# Patient Record
Sex: Female | Born: 1940 | Race: White | Hispanic: No | Smoking: Never smoker
Health system: Southern US, Community
[De-identification: ages and names within clinical notes are randomized; demographics above are authoritative.]

## PROBLEM LIST (undated history)

## (undated) DIAGNOSIS — I639 Cerebral infarction, unspecified: Secondary | ICD-10-CM

## (undated) DIAGNOSIS — I499 Cardiac arrhythmia, unspecified: Secondary | ICD-10-CM

## (undated) DIAGNOSIS — F329 Major depressive disorder, single episode, unspecified: Secondary | ICD-10-CM

## (undated) DIAGNOSIS — I10 Essential (primary) hypertension: Secondary | ICD-10-CM

## (undated) DIAGNOSIS — F32A Depression, unspecified: Secondary | ICD-10-CM

## (undated) DIAGNOSIS — I4891 Unspecified atrial fibrillation: Secondary | ICD-10-CM

## (undated) DIAGNOSIS — K219 Gastro-esophageal reflux disease without esophagitis: Secondary | ICD-10-CM

## (undated) DIAGNOSIS — E039 Hypothyroidism, unspecified: Secondary | ICD-10-CM

## (undated) HISTORY — PX: UPPER GI ENDOSCOPY: SHX6162

## (undated) HISTORY — DX: Cerebral infarction, unspecified: I63.9

## (undated) HISTORY — DX: Major depressive disorder, single episode, unspecified: F32.9

## (undated) HISTORY — PX: APPENDECTOMY: SHX54

## (undated) HISTORY — DX: Unspecified atrial fibrillation: I48.91

## (undated) HISTORY — PX: ABDOMINAL HYSTERECTOMY: SHX81

## (undated) HISTORY — DX: Depression, unspecified: F32.A

## (undated) HISTORY — PX: JOINT REPLACEMENT: SHX530

## (undated) HISTORY — PX: OTHER SURGICAL HISTORY: SHX169

## (undated) HISTORY — DX: Hypothyroidism, unspecified: E03.9

## (undated) HISTORY — PX: COLONOSCOPY: SHX174

---

## 2013-09-29 DIAGNOSIS — I639 Cerebral infarction, unspecified: Secondary | ICD-10-CM

## 2013-09-29 HISTORY — DX: Cerebral infarction, unspecified: I63.9

## 2013-10-06 ENCOUNTER — Encounter: Payer: Self-pay | Admitting: Cardiology

## 2013-10-07 ENCOUNTER — Encounter: Payer: Self-pay | Admitting: Internal Medicine

## 2013-10-07 ENCOUNTER — Ambulatory Visit: Payer: Self-pay | Admitting: Interventional Cardiology

## 2013-10-07 ENCOUNTER — Non-Acute Institutional Stay (SKILLED_NURSING_FACILITY): Payer: Medicare Other | Admitting: Internal Medicine

## 2013-10-07 ENCOUNTER — Telehealth: Payer: Self-pay | Admitting: *Deleted

## 2013-10-07 DIAGNOSIS — I639 Cerebral infarction, unspecified: Secondary | ICD-10-CM | POA: Insufficient documentation

## 2013-10-07 DIAGNOSIS — E039 Hypothyroidism, unspecified: Secondary | ICD-10-CM

## 2013-10-07 DIAGNOSIS — I4892 Unspecified atrial flutter: Secondary | ICD-10-CM | POA: Insufficient documentation

## 2013-10-07 DIAGNOSIS — N189 Chronic kidney disease, unspecified: Secondary | ICD-10-CM | POA: Insufficient documentation

## 2013-10-07 DIAGNOSIS — N183 Chronic kidney disease, stage 3 (moderate): Secondary | ICD-10-CM

## 2013-10-07 DIAGNOSIS — I6350 Cerebral infarction due to unspecified occlusion or stenosis of unspecified cerebral artery: Secondary | ICD-10-CM

## 2013-10-07 DIAGNOSIS — E7800 Pure hypercholesterolemia, unspecified: Secondary | ICD-10-CM

## 2013-10-07 DIAGNOSIS — I10 Essential (primary) hypertension: Secondary | ICD-10-CM | POA: Insufficient documentation

## 2013-10-07 NOTE — Telephone Encounter (Signed)
Beth Johnson with [facility] called with New Patient Referral. Patient is s/p CVA, currently in Rehab facility. PCP - Carilyn Goodpasture, PA [phone]).  Called patient's daughter, Madison Hickman ([phone]), to coordinate appointment.  Patient given option to be seen this morning, however patient's daughter is unable to get patient here this morning. Scheduled for Tuesday, December 9th, at 10:00 am, with Dr. Donato Schultz.  Daugther states that patient normally lives in [place] but was up here over Thanksgiving and had CVA episode, hospitalized and is now being treated at an in-patient rehab facility locally. Patient will be staying with daughter for after care/treatment.  Daughter will bring patient's medications, recent labwork results and any scans/medical notes/history with her to appointment. Also instructed to come at least 15 minutes early to check-in and provide new patient/insurance information. Currently patient is on Xarelto 15mg  by mouth daily. Daughter advised to call back for any questions or concerns. Provided her with location information for office.

## 2013-10-07 NOTE — Progress Notes (Signed)
Patient ID: Beth Johnson, female   DOB: [birth year], 72 y.o.   MRN: [id number]  [name] [place]   PCP: No primary provider on file.  Code Status: full code  Allergies  Allergen Reactions  . Penicillins   . Sulfa Antibiotics     Chief Complaint: new admit  HPI:  72 y/o female patient is here for STR after hospital admission with CVA on 09/29/13. New acute vs subacute cortical infarction was noted in right frontal convexity and She also had episode of atrial flutter. Thus this was thought to be an embolic stroke. She was started on aspirin and lipitor. Her xarelto for anticoagulation was renally dosed given her ckd stage 3. PT/OT and SLP saw her in hospital and recommended STR. She was also noted to have some memory loss. She has hx of hypothyroidism. She was seen in her room today. She is working well with therapy team. She has no concerns this visit.  Review of Systems  Constitutional: Negative for fever, chills, weight loss, malaise/fatigue and diaphoresis.  HENT: Negative for congestion, hearing loss and sore throat.   Eyes: Negative for blurred vision, double vision and discharge.  Respiratory: Negative for cough, sputum production, shortness of breath and wheezing.   Cardiovascular: Negative for chest pain, palpitations, orthopnea and leg swelling.  Gastrointestinal: Negative for heartburn, nausea, vomiting, abdominal pain, diarrhea and constipation.  Genitourinary: Negative for dysuria, urgency, frequency and flank pain.  Musculoskeletal: Negative for back pain, falls, joint pain and myalgias.  Skin: Negative for itching and rash.  Neurological: Negative for dizziness, tingling, focal weakness and headaches.  Psychiatric/Behavioral: Negative for depression and memory loss. The patient is not nervous/anxious.    pmh Hypothyroidism, dyslipidemia, ASD  psh Hysterectomy Total hip arthroplasty  Social hx No use of tobacco or alcohol. Lives alone in [place]. Has  her daughter and sister in [place]  Medications: Patient's Medications  New Prescriptions   No medications on file  Previous Medications   LEVOTHYROXINE (SYNTHROID, LEVOTHROID) 50 MCG TABLET    Take 50 mcg by mouth daily before breakfast.   RIVAROXABAN (XARELTO) 15 MG TABS TABLET    Take 15 mg by mouth daily with supper.  Modified Medications   No medications on file  Discontinued Medications   No medications on file     Physical Exam: Filed Vitals:   10/07/13 1443  BP: 134/93  Pulse: 60  Temp: 97.7 F (36.5 C)  Resp: 17  SpO2: 96%   General- elderly female in no acute distress Head- atraumatic, normocephalic Eyes- PERRLA, EOMI, no pallor, no icterus, no discharge Neck- no lymphadenopathy, no thyromegaly, no jugular vein distension, no carotid bruit Chest- no chest wall deformities, no chest wall tenderness Cardiovascular- normal s1,s2, irregular heart rate, no murmurs/ rubs/ gallops Respiratory- bilateral clear to auscultation, no wheeze, no rhonchi, no crackles Abdomen- bowel sounds present, soft, non tender, no organomegaly, no guarding or rigidity, no CVA tenderness Musculoskeletal- able to move all 4 extremities, no spinal and paraspinal tenderness, steady gait, no use of assistive device Neurological- no focal deficit, normal reflexes, normal muscle strength Psychiatry- alert and oriented to person, place and time, normal mood and affect   Labs reviewed:  09/30/13 wbc 8.8, hb 11.5, hct 34.5, plt 416, na 134, k 4, bun 12, cr 1.36, ca 8.4 09/29/13 tsh 2.65, t.chol 127, ldl 58, hdl 45, tg 119  Reviewed echocardiogram, ct head and CTA, see patient's chart for full detail  Assessment/Plan  CVA- had recent embolic stroke. bp has  been elevated in the facility. Will have her started on amlodipine 5 mg daily to help keep bp < 140/90. Also will add lipitor 10 mg daily with recent stroke for mortality benefit. Continue xarelto. No bleed reported. To work with PT and OT  for muscle group strengthening  A.flutter/ fib- continue xarelto. Rate is controlled at present. Monitor clinically  Hypothyroidism- continue current dose of levothyroxine, monitor clinically  CKD stage 3- to monitor and have bp under control. avoid NSAIDS  Family/ staff Communication: reviewed care plan with patient and nursing supervisor  Goals of care: return home after STR   Labs/tests ordered: cbc, cmp, tsh in 2-3 weeks

## 2013-10-10 ENCOUNTER — Encounter: Payer: Self-pay | Admitting: Cardiology

## 2013-10-10 ENCOUNTER — Encounter: Payer: Self-pay | Admitting: *Deleted

## 2013-10-11 ENCOUNTER — Ambulatory Visit (INDEPENDENT_AMBULATORY_CARE_PROVIDER_SITE_OTHER): Payer: Medicare Other | Admitting: Cardiology

## 2013-10-11 ENCOUNTER — Encounter: Payer: Self-pay | Admitting: Cardiology

## 2013-10-11 ENCOUNTER — Encounter (INDEPENDENT_AMBULATORY_CARE_PROVIDER_SITE_OTHER): Payer: Self-pay

## 2013-10-11 ENCOUNTER — Non-Acute Institutional Stay (SKILLED_NURSING_FACILITY): Payer: Medicare Other | Admitting: Internal Medicine

## 2013-10-11 ENCOUNTER — Encounter: Payer: Self-pay | Admitting: Internal Medicine

## 2013-10-11 VITALS — BP 134/89 | Ht 61.5 in | Wt 119.8 lb

## 2013-10-11 DIAGNOSIS — E039 Hypothyroidism, unspecified: Secondary | ICD-10-CM

## 2013-10-11 DIAGNOSIS — I10 Essential (primary) hypertension: Secondary | ICD-10-CM

## 2013-10-11 DIAGNOSIS — Z7901 Long term (current) use of anticoagulants: Secondary | ICD-10-CM | POA: Insufficient documentation

## 2013-10-11 DIAGNOSIS — Z8673 Personal history of transient ischemic attack (TIA), and cerebral infarction without residual deficits: Secondary | ICD-10-CM

## 2013-10-11 DIAGNOSIS — I4892 Unspecified atrial flutter: Secondary | ICD-10-CM

## 2013-10-11 DIAGNOSIS — I639 Cerebral infarction, unspecified: Secondary | ICD-10-CM

## 2013-10-11 DIAGNOSIS — I4891 Unspecified atrial fibrillation: Secondary | ICD-10-CM

## 2013-10-11 DIAGNOSIS — I6350 Cerebral infarction due to unspecified occlusion or stenosis of unspecified cerebral artery: Secondary | ICD-10-CM

## 2013-10-11 DIAGNOSIS — E7800 Pure hypercholesterolemia, unspecified: Secondary | ICD-10-CM

## 2013-10-11 MED ORDER — DILTIAZEM HCL ER COATED BEADS 120 MG PO CP24: 120.0000 mg | ORAL_CAPSULE | Freq: Every day | ORAL | Status: DC

## 2013-10-11 NOTE — Progress Notes (Signed)
Patient ID: Beth Johnson, female   DOB: Feb 02, 1941, 72 y.o.   MRN: [id number]  Location:  [facility] Kashay Cavenaugh L. Renato Gails, D.O., C.M.D.  PCP: Genelle Bal A Christoffersen  Allergies  Allergen Reactions  . Penicillins   . Sulfa Antibiotics     Chief Complaint  Patient presents with  . Discharge Note    discharge home    HPI:  72 yo female was here for rehab s/p stroke 09/29/13.  She had an episode of aflutter during the hospital stay also so it was thought that her stroke was embolic.  She was started on asa and lipitor, but her family does not want her on lipitor.  She was continued on xarelto.  She will need to continue to work with speech therapy outpatient but has completed her PT, OT.  She has some memory loss.      Review of Systems:  Review of Systems  Constitutional: Negative for malaise/fatigue.  HENT: Negative for congestion.   Eyes: Negative for blurred vision.  Respiratory: Negative for shortness of breath.   Cardiovascular: Negative for chest pain.  Gastrointestinal: Negative for constipation.  Genitourinary: Negative for dysuria.  Skin: Negative for rash.  Neurological: Negative for loss of consciousness and weakness.       Speech difficulty  Endo/Heme/Allergies: Bruises/bleeds easily.  Psychiatric/Behavioral: Positive for memory loss.     Past Medical History  Diagnosis Date  . CVA (cerebral infarction)     new onset A. fib with CVA 09/2013: LUE weakness, left visual field deficit, facial draw, altered cognition  . Atrial fibrillation   . Depression   . Hypothyroidism     Past Surgical History  Procedure Laterality Date  . Abdominal hysterectomy    . Bil hip replacement ( 2 on ? left)    . Appendectomy      Social History:   reports that she has never smoked. She does not have any smokeless tobacco history on file. Her alcohol and drug histories are not on file.  Family History  Problem Relation Age of Onset  . Heart attack Father     . CAD Father   . Gallstones Mother   . Cancer Mother     Medications: Patient's Medications  New Prescriptions   DILTIAZEM (CARDIZEM CD) 120 MG 24 HR CAPSULE    Take 1 capsule (120 mg total) by mouth daily.  Previous Medications   ESCITALOPRAM (LEXAPRO) 5 MG TABLET    Take 5 mg by mouth daily.   LEVOTHYROXINE (SYNTHROID, LEVOTHROID) 50 MCG TABLET    Take 50 mcg by mouth daily before breakfast.   RIVAROXABAN (XARELTO) 15 MG TABS TABLET    Take 15 mg by mouth daily with supper.  Modified Medications   No medications on file  Discontinued Medications   No medications on file    Physical Exam: Filed Vitals:   10/11/13 1526  BP: 105/62  Pulse: 86  Temp: 96.6 F (35.9 C)  Resp: 18  Height: 5\' 2"  (1.575 m)  Weight: 120 lb (54.432 kg)   Physical Exam  Assessment/Plan:   1. CVA (cerebral infarction) -completed short term PT, OT -to go home now with ST through home health -doing well -stroke felt to be embolic from aflutter  2. Hypertension -cont current meds for adequate bp control -cardiac prudent diet  3. Unspecified hypothyroidism -cont synthroid  4. Hypercholesterolemia -lipitor is recommended, but pt's family refuses it   5. Atrial flutter -was paroxysmal, but felt to be cause of  stroke -cont xarelto renally dosed-will need f/u cbc outpatient  Patient is being discharged with home health services:  Home health RN eval and ST  Patient is being discharged with the following durable medical equipment:  None needed  Patient has been advised to f/u with their PCP in 1-2 weeks to bring them up to date on their rehab stay.  They were provided with a 30 day supply of scripts for prescription medications and refills must be obtained from their PCP.  Of note, pt's daughter contacted the PCP during rehab stay and received order for lexapro and to stop the statin that had been restarted due to her recent stroke.  She will not be sent with a prescription for the statin  though it is recommended due to family desire not to use the medication.

## 2013-10-11 NOTE — Progress Notes (Signed)
[address] [place], [place]  [address] Phone: [phone] Fax:  [phone]  Date:  10/11/2013   ID:  Beth Johnson, DOB 31-Oct-1941, MRN [id number]  PCP:  Jolyn Nap   History of Present Illness: Beth Johnson is a 72 y.o. female status post CVA at rehabilitation facility here for evaluation of atrial flutter. Currently on Xarelto 15 mg a day.  I have reviewed Launa Grill note and this is a portion of it: "Pt was found on Thanksgiving in her home confused and with slurred speech, left facial draw and left sided weakness after she did not respond to calls all day. She was admitted as [facility] [place] that evening for an acute stroke. Assessment concluded it had likely occured in the prior 24-48 hours based on her CT findings and exam. She was also found to be in new onset a.fib.  She had LUE weakness, facial draw, confusion and some recall issues and left peripheral visual field deficits. She made tremendous gains while in the hospital, and at this point has regained all of her sight and most of her LUE strength. Still some ongoing issues with memory and recall.  However, according to her daughter there were already some preceding memory concerns prior to the CVA that had raised the question of an early dementia, and she would like to evaluate this further while her mom is here in [place]. Her mother has been reluctant to discuss or acknowledge this but has admitted to writing notes to herself to remember items and some forgetfulness when driving in unfamiliar places. Her daughter privately shared that she has noted noticeable forgetfulness in conversation, repetitious conversations, a flattening of affect and at times a sense that her mom is ignoring or cannot attend to a conversation. To this point there have been no major incidents or safety issues till the stroke. She did not/could not answer her phone all day on Thanksgiving when she was home  alone till her sister finally reached her that night and called police to the home along with EMS. Daughter states that her driving is restricted to a small repetitive course of familiar places in her hometown. Pt did have some sundowning in the hospital. 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 Her daughter would like to proceed with initiating evals with neurology for CVA follow up and memory issues , as well as a cardiologist. Pt is on Xarelto starting today- 15mg .  Pt has had [facility] full [facility] on which she performed quite well. They don't expect her to need to be there more than a week or so. She had a Berg balance scale and got 55/56. Main PT/OT deficits identified were some balance issues. Has almost full function of and strength in her LUE. There have been no swallowing i or choking ssues. She does have some partial aphasia and word findings issues at times and has been more forgetful , flat and confused at times since the stroke. Her daughter is also concerned she has been depressed as well before the stroke and mentions she and another family member have done well on lexapro.  They hope to transition Beth Johnson to their home here in [place] while she is unable to drive after SNF discharge  and get home therapy is appropriate with the ultimate goal of restoring her level of functioning such that she can be cleared to drive and return home.She is not a vegetarian. She eats chicken and fish, PB and lunch meat. Diet is fairly repetitious but pretty healthy. No prior hx of anemia..  Atrial Fibrillation F/U:  c/o Palpitations no. She has picked up her scrip for Xarelto 15 mg today. She is not on any rate controlling medications. She has no other complaints and her daughter has not  observed any breathlessness, DOE, chest pain, dizziness or syncope. Pt denies all of the above as well. She did not have any known cardiac hx prior to the stroke that her daughter is aware of. They are hopeful that she may be a candidate for cardioversion"  In May 2014 PCP saw her and dx of AFIB was made according to daughter. PT has cleared her. Will be out by Friday. 2 years ago ? Recent ECHO OK. Normal EF. HR mildly elevated in hospital. She currently is asymptomatic in regards to her atrial fibrillation. She's not realize that she is in atrial fibrillation. According to notes, there was a prior history of atrial septal defect that was not noted on her bubble study. In other words I assume her bubble study did not demonstrate this. Injection of contrast documented no intra-atrial shunt. Mild MR/mild TR. There were no significant narrowing of the neck arteries. Stroke occurred in subacute cortical infarction of right frontal convexity. Chest x-ray was unremarkable. Her daughter present for discussion is very integral in her care. Normally her mother resides in [place].   Wt Readings from Last 3 Encounters:  10/11/13 119 lb 12.8 oz (54.341 kg)     Past Medical History  Diagnosis Date  . CVA (cerebral infarction)     new onset A. fib with CVA 09/2013: LUE weakness, left visual field deficit, facial draw, altered cognition  . Atrial fibrillation   . Depression   . Hypothyroidism     Past Surgical History  Procedure Laterality Date  . Abdominal hysterectomy    . Bil hip replacement ( 2 on ? left)    . Appendectomy      Current Outpatient Prescriptions  Medication Sig Dispense Refill  . escitalopram (LEXAPRO) 5 MG tablet Take 5 mg by mouth daily.      Marland Kitchen levothyroxine (SYNTHROID, LEVOTHROID) 50 MCG tablet Take 50 mcg by mouth daily before breakfast.      . Rivaroxaban (XARELTO) 15 MG TABS tablet Take 15 mg by mouth daily with supper.       No current facility-administered medications  for this visit.    Allergies:    Allergies  Allergen Reactions  . Penicillins   . Sulfa Antibiotics     Social History:  The patient  reports that she has never smoked. She does not have any smokeless tobacco history on file.   Family History  Problem Relation Age of Onset  . Heart attack Father   . CAD Father   . Gallstones Mother   . Cancer Mother     ROS:  Please see the history of present illness.   Positive for stroke, no bleeding, no syncope, no chest pain, no shortness of breath   All other systems reviewed and negative.   PHYSICAL EXAM: VS:  BP 134/89  Ht 5' 1.5" (1.562 m)  Wt 119 lb 12.8 oz (54.341 kg)  BMI 22.27 kg/m2 Well nourished, well developed, in no acute distress HEENT: normal,  [place]/AT, EOMI Neck: no JVD, normal carotid upstroke, no bruit Cardiac: Irregularly irregular, rate 90 no murmur Lungs:  clear to auscultation bilaterally, no wheezing, rhonchi or rales Abd: soft, nontender, no hepatomegaly, no bruits Ext: no edema, 2+ distal pulses Skin: warm and dry GU: deferred Neuro: no focal abnormalities noted, AAO x 3  EKG:  Atrial fibrillation/flutter heart rate 90, no other abnormalities  ASSESSMENT AND PLAN:  1. Atrial fibrillation-likely permanent, has been in since 5/14, asymptomatic. We will continue with rate control strategy. My plan will be to add low-dose diltiazem CD 120 mg once a day to her drug regimen. Her heart rate currently at rest is 90. I will see her back in 2 weeks and likely place a 24-hour Holter monitor to ensure proper rate control. We had lengthy discussion about rate versus rhythm control. Given her persistent nature of atrial fibrillation and asymptomatic response , we will continue with rate control. I explained that it is likely that either cardioversion would be unsuccessful or shortly after cardioversion, atrial fibrillation may return. Also regardless of ability to take her out of atrial fibrillation, she still needs  anticoagulation. 2. Chronic anticoagulation-continue with Xarelto.  Currently at 15 mg because of chronic kidney disease. We will continue to monitor. Recent creatinine has been 1.57. 3. History of stroke - this occurred over Thanksgiving, unexpected, likely secondary to atrial fibrillation. They did not wish to take statin therapy. We discussed the pros and cons. Given the likely embolic source and normal LDL, no statin seems like a reasonable approach. 4. We will see back in 2 weeks.  Signed, Donato Schultz, MD [facility]  10/11/2013 10:31 AM

## 2013-10-11 NOTE — Patient Instructions (Addendum)
Your physician has recommended you make the following change in your medication:   1. Start Diltiazem CD 120mg  once daily  Your physician recommends that you schedule a follow-up appointment in: 2 weeks with Dr. Anne Fu

## 2013-10-19 ENCOUNTER — Telehealth: Payer: Self-pay | Admitting: Neurology

## 2013-10-19 ENCOUNTER — Other Ambulatory Visit: Payer: Self-pay | Admitting: Neurology

## 2013-10-19 DIAGNOSIS — I6350 Cerebral infarction due to unspecified occlusion or stenosis of unspecified cerebral artery: Secondary | ICD-10-CM

## 2013-10-19 NOTE — Telephone Encounter (Signed)
Revonda Standard from [facility] left message that the patient was referred for home health speech therapy instead of outpatient ST.  She is asking if we can make the outpatient referral, patient is seeing Dr. Pearlean Brownie Thursday for office visit.  I left her a message that I don't see who did the initial referral, and wasn't sure who should put in outpatient referral.  I asked her to return my call.

## 2013-10-19 NOTE — Telephone Encounter (Signed)
Okay to make referral for outpatient speech therapy

## 2013-10-19 NOTE — Telephone Encounter (Signed)
Referral has been made for outpatient ST, per Dr. Pearlean Brownie.

## 2013-10-19 NOTE — Telephone Encounter (Signed)
Spoke to Madison the ST from [facility], she thinks the referral came from the rehab facility the patient was in.  She was trying to be pro active for the patient's family and get the patient started, because the plan is to get her back to independent living in [place].  I explained that it's up to the doctor to decide if she needs the speech therapy but I would get him the message.

## 2013-10-20 ENCOUNTER — Encounter: Payer: Self-pay | Admitting: Neurology

## 2013-10-20 ENCOUNTER — Ambulatory Visit (INDEPENDENT_AMBULATORY_CARE_PROVIDER_SITE_OTHER): Payer: Medicare Other | Admitting: Neurology

## 2013-10-20 ENCOUNTER — Ambulatory Visit: Payer: Medicare Other | Attending: Internal Medicine | Admitting: Audiology

## 2013-10-20 VITALS — BP 111/75 | HR 76 | Temp 97.7°F | Ht 63.5 in | Wt 118.0 lb

## 2013-10-20 DIAGNOSIS — F0280 Dementia in other diseases classified elsewhere without behavioral disturbance: Secondary | ICD-10-CM | POA: Insufficient documentation

## 2013-10-20 DIAGNOSIS — H9193 Unspecified hearing loss, bilateral: Secondary | ICD-10-CM

## 2013-10-20 DIAGNOSIS — H918X9 Other specified hearing loss, unspecified ear: Secondary | ICD-10-CM | POA: Insufficient documentation

## 2013-10-20 DIAGNOSIS — H93293 Other abnormal auditory perceptions, bilateral: Secondary | ICD-10-CM

## 2013-10-20 DIAGNOSIS — H93299 Other abnormal auditory perceptions, unspecified ear: Secondary | ICD-10-CM | POA: Insufficient documentation

## 2013-10-20 MED ORDER — CEREFOLIN NAC 6-2-600 MG PO TABS: 1.0000 | ORAL_TABLET | ORAL | Status: DC

## 2013-10-20 MED ORDER — DONEPEZIL HCL 5 MG PO TABS: 5.0000 mg | ORAL_TABLET | Freq: Every day | ORAL | Status: DC

## 2013-10-20 NOTE — Progress Notes (Signed)
[facility] [address] [place]. [place] [address] [phone]       OFFICE CONSULT NOTE  [name] Date of Birth:  05/26/1941 Medical Record Number:  [id number]   Referring MD:  Carilyn Goodpasture, NP  Reason for Referral:  Stroke and dementia  HPI: 41 year Caucasian Lahey is accompanied by her daughter who provides most of the history. She was admitted on 11/I7/14 to [facility] in [facility] with sudden onset of dysarthria and left facial droop. She also was found to have new-onset atrial fibrillation and flutter. CT angiogram of the brain and neck did not reveal any large vessel stenosis. I do not have actual reports or films to look at but the patient's daughter had her electronic medical records on her I pad which I was able to scroll through and go through the records and review them. Lipid profile was normal hemoglobin A1c was 5.3 RPR was negative TSH was normal. CT scan showed a right frontal hypodensity consistent with an subacute stroke. MRI was attempted but patient was extremely claustrophobic and hence it could not be completed. Patient recovered her speech and left facial weakness quite soon. She however has had noted cognitive decline. The patient daughter states that she was aware of patient's progressive memory decline for the last couple of years. She lives alone in [place] but since her daughter lives in this 4 she was transferred for rehabilitation to [facility] here in [place]. Patient is improving and the daughter is not thinking of patient moving back home to [place]. She has had issues with sundowning and mild agitation and anxiety and has been started on Lexapro one week ago. She has had no issues with gait balance or falls. She does not have any delusions or hallucinations. Patient has never been tried on medications like Aricept. She has been started on xarelto for her atrial fibrillation and seems to  tolerating it well with only minor bruising and no bleeding. There is no family history of dementia. Patient has no history of seizures, significant head injury with loss of consciousness or other strokes. She has a two-year college degree and is retired and lives alone.  ROS:   14 system review of systems is positive for memory loss, confusion, anxiety, depression, sundowning only and all other systems negative  PMH:  Past Medical History  Diagnosis Date  . CVA (cerebral infarction)     new onset A. fib with CVA 09/2013: LUE weakness, left visual field deficit, facial draw, altered cognition  . Atrial fibrillation   . Depression   . Hypothyroidism     Social History:  History   Social History  . Marital Status: Unknown    Spouse Name: N/A    Number of Children: 1  . Years of Education: college   Occupational History  . retired    Social History Main Topics  . Smoking status: Never Smoker   . Smokeless tobacco: Not on file  . Alcohol Use: Yes  . Drug Use: No  . Sexual Activity: Not on file   Other Topics Concern  . Not on file   Social History Narrative  . No narrative on file    Medications:   Current Outpatient Prescriptions on File Prior to Visit  Medication Sig Dispense Refill  . diltiazem (CARDIZEM CD) 120 MG 24 hr capsule Take 1 capsule (120 mg total) by mouth daily.  30 capsule  4  . escitalopram (LEXAPRO) 5 MG tablet Take  5 mg by mouth daily.      Marland Kitchen levothyroxine (SYNTHROID, LEVOTHROID) 50 MCG tablet Take 50 mcg by mouth daily before breakfast.      . Rivaroxaban (XARELTO) 15 MG TABS tablet Take 15 mg by mouth daily with supper.       No current facility-administered medications on file prior to visit.    Allergies:   Allergies  Allergen Reactions  . Penicillins   . Sulfa Antibiotics     Physical Exam General: Frail petite elderly Caucasian lady seated, in no evident distress Head: head normocephalic and atraumatic. Orohparynx benign Neck: supple  with no carotid or supraclavicular bruits Cardiovascular: regular rate and rhythm, no murmurs Musculoskeletal: no deformity Skin:  no rash/petichiae Vascular:  Normal pulses all extremities Filed Vitals:   10/20/13 1323  BP: 111/75  Pulse: 76  Temp: 97.7 F (36.5 C)    Neurologic Exam Mental Status: Awake and fully alert. Oriented to place and time. Recent and remote memory diminished. Attention span, concentration and fund of knowledge poor. Mood and affect appropriate. Mini-Mental status exam score 22/28. Animal naming test 3 only. Clock drawing 2/4. Syntax of independence in activities of daily living score 6 which is independent. Instrumental activities of daily living scale 8. NPI-Q. scored 1 for depression 2 for anxeity 1 for motor disturbance and 1 for change in appetite. Cranial Nerves: Fundoscopic exam reveals sharp disc margins. Pupils equal, briskly reactive to light. Extraocular movements full without nystagmus. Visual fields full to confrontation. Hearing intact. Facial sensation intact. Face, tongue, palate moves normally and symmetrically.  Motor: Normal bulk and tone. Normal strength in all tested extremity muscles. Sensory.: intact to touch and pinprick and vibratory sensation.  Coordination: Rapid alternating movements normal in all extremities. Finger-to-nose and heel-to-shin performed accurately bilaterally. Gait and Station: Arises from chair without difficulty. Stance is normal. Gait demonstrates normal stride length and balance . Not able to heel, toe and tandem walk without difficulty.  Reflexes: 1+ and symmetric. Toes downgoing.   NIHSS  0 Modified Rankin  1   ASSESSMENT:  73 year Caucasian lady with a small right frontal embolic infarct from atrial fibrillation in November 2014 with suspect baseline mild dementia which is now exacerbated and is likely Alzheimer's   PLAN: I had a long discussion the patient and daughter regarding her recent stroke as well as  dementia and answered questions. Continue rivaroxaban for stroke prevention for atrial fibrillation. Start Cerefolin nac for mild dementia as well as Aricept 5 mg daily for one month increase if tolerated to 10 mg daily. I advised the patient to followup with a neurologist in the [place] area as she plans to move there soon.

## 2013-10-20 NOTE — Patient Instructions (Signed)
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 She has excellent word recognition in quiet. In minimal background noise her word recognition drops to poor.  In addition she has a severe auditory processing component, especially adversely affecting her left ear that is temporal processing in nature.   Recommend: 1)  TV amplifier. 2) Speech therapy for auditory processing and language processing assessment. 3)  Consider using Auditory Workout for the IPAD.       For PC's there is the LACE program.         [employer] Phonological Awareness,  Auditory Memory and Following Directions.Carlyn Reichert Kate Sable, Au.D., CCC-A Doctor of Audiology

## 2013-10-20 NOTE — Patient Instructions (Signed)
I had a long discussion the patient and daughter regarding her recent stroke as well as dementia and answered questions. Continue rivaroxaban for stroke prevention for atrial fibrillation. Start Cerefolin nac for mild dementia as well as Aricept 5 mg daily for one month increase if tolerated to 10 mg daily. I advised the patient to followup with a neurologist in the [place] area as she plans to move there soon.

## 2013-10-21 NOTE — Procedures (Signed)
[facility] [address] [place], [place]  [address] [phone]  AUDIOLOGICAL  EVALUATION  NAME: Beth Johnson  STATUS: Outpatient DOB:   April 29, 1941   DIAGNOSIS: Evaluate hearing loss, recent CVA (stroke)                           MRN: [id number]                                                                                      DATE: 10/21/2013   REFERENT: Jolyn Nap, MD  HISTORY: Beth Johnson,  was seen for an audiological evaluation following a recent "stroke this Thanksgiving".  Beth. Beth Johnson was accompanied by her daughter.  They report a "gradual hearing loss over the past couple of years". Her daughter has noticed that Beth Johnson "has a loss of high pitched noises" and is having difficulty "hearing in background noise".  Beth Johnson denies feeling dizzy, lightheaded or having vertigo.   It is important to note that there is a maternal family history of hearing loss.  EVALUATION: Pure tone air conduction testing showed 40-45 dBHL at 250Hz ; 20 dBHL at 500Hz  - 1000Hz ; 25 dBHL at 2000Hz ; 50dBHL at 4000hz  and 80-85 dBHL at 8000Hz .  The hearing loss is sensorineural.  Speech reception thresholds are 30 dBHL on the left and 25 dBHL on the right using recorded spondee word lists. Masked word recognition was 94% at 75 dBHL on the left at and 100% at 65 dBHL on the right using recorded NU-6 word lists, in quiet.  Otoscopic inspection reveals clear ear canals with visible tympanic membranes.  Tympanometry showed (Type A) with normal middle ear pressure and elevated to absent acoustic reflexes bilaterally.    Speech-in-Noise testing was performed to determine speech discrimination in the presence of background noise.  Beth Johnson scored 56 % in the right ear and 54 % in the left ear, when noise was presented 5 dB below speech. Beth Johnson is expected to have significant difficulty hearing and understanding in minimal background noise.       Competing  Sentences (CS) involved a different sentences being presented to each ear at different volumes. The instructions are to repeat the softer volume sentences. Posterior temporal issues will show poorer performance in the ear contralateral to the lobe involved. What is normal varies by age. Beth Johnson scored 90% in the right ear and 0% in the left ear.  The test results in the left ear are abnormal and indicate that Beth Johnson has severe left sided (right hemisphere based)  temporal processing issue.  Dichotic Digits (DD) presents different single digits to each ear. Both digits are to be repeated. Beth Johnson scored 86% in the right ear and 10% in the left ear. The test results indicate that [name] scored abnormal on the left side.   CONCLUSIONS: Beth. 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 The hearing loss is symmetrical, typical of presbycusis (hearing loss associated with aging), with no indication that this hearing loss is directly related to the recent stroke.  She  has excellent word recognition in quiet at loud conversational speech levels. However, In minimal background noise her word recognition drops to poor in each ear.  When speaking with Beth. Beth Johnson, she appeared to have more difficulty with communication than her hearing loss would suggest so auditory processing tests were administered.  Beth. Beth Johnson has a severe auditory processing component that is adversely affecting her and may be related to the recent stroke since her left ear (right hemisphere posterior temporal area) is primarily affected; however, other causes including dementia may create these findings.  Beth. Beth Johnson scored very poor and was unable to repeat most single digit numbers or sentences in the left ear, whereas most of these were correctly repeated in the right ear. Beth. Beth Johnson was noticeably upset by this information. As discussed  with the family, at home speech services by a speech pathologist may be the most helpful for Beth. Beth Johnson.  In addition, the computer based auditory processing programs listed below were recommended.  RECOMMENDATIONS: 1)  TV amplifier to allow Beth. Beth Johnson to increase loudness to a comfortable listening level and help reduce background noise. 2) Speech receptive and expressive language assessment with at home therapy for auditory processing therapy. 3)  To help improve hearing in minimal background noise and auditory processing skills consider using auditory processing computer programs at home such as the Auditory Workout for the IPAD or for PC's there is the Beth Johnson program. [employer] may be used on [employer] or IPAD and include Phonological Awareness,  Auditory Memory and Following Directions. 4) Monitor hearing closely with a repeat evaluation in 3 months-possibly consider a hearing aid evaluation at that time. 5) Strategies that help improve hearing include: a) Face the speaker directly. Optimal is having the speakers face well - lit.  Unless amplified, being within 3-6 feet of the speaker will enhance word recognition. b) Avoid having the speaker back-lit as this will minimize the ability to use cues from lip-reading, facial expression and gestures. c)  Word recognition is poorer in background noise. For optimal word recognition, turn off the TV, radio or noisy fan when engaging in conversation. In a restaurant, try to sit away from noise sources and close to the primary speaker.  d)  Ask for topic clarification from time to time in order to remain in the conversation.  Most people don't mind repeating or clarifying a point when asked.  If needed, explain the difficulty hearing in background noise or hearing loss.    Deborah L. Kate Sable, Au.D., CCC-A Doctor of Audiology

## 2013-10-24 ENCOUNTER — Telehealth: Payer: Self-pay

## 2013-10-24 ENCOUNTER — Ambulatory Visit (INDEPENDENT_AMBULATORY_CARE_PROVIDER_SITE_OTHER): Payer: Medicare Other | Admitting: Cardiology

## 2013-10-24 ENCOUNTER — Encounter: Payer: Self-pay | Admitting: Cardiology

## 2013-10-24 VITALS — BP 116/70 | HR 79 | Ht 63.5 in | Wt 118.0 lb

## 2013-10-24 DIAGNOSIS — Z7901 Long term (current) use of anticoagulants: Secondary | ICD-10-CM

## 2013-10-24 DIAGNOSIS — I4892 Unspecified atrial flutter: Secondary | ICD-10-CM

## 2013-10-24 DIAGNOSIS — F0280 Dementia in other diseases classified elsewhere without behavioral disturbance: Secondary | ICD-10-CM

## 2013-10-24 DIAGNOSIS — Z8673 Personal history of transient ischemic attack (TIA), and cerebral infarction without residual deficits: Secondary | ICD-10-CM

## 2013-10-24 MED ORDER — CEREFOLIN NAC 6-2-600 MG PO TABS: 1.0000 | ORAL_TABLET | Freq: Every day | ORAL | Status: DC

## 2013-10-24 NOTE — Telephone Encounter (Signed)
Daughter called requesting a refill on Cerefolin.  She would like it sent to [facility].  I have sent the Rx.  She is aware.

## 2013-10-24 NOTE — Patient Instructions (Signed)
Your physician recommends that you continue on your current medications as directed. Please refer to the Current Medication list given to you today.  Your physician recommends that you follow-up as needed.  

## 2013-10-24 NOTE — Progress Notes (Signed)
[address] [place], [place]  [address] Phone: [phone] Fax:  [phone]  Date:  10/24/2013   ID:  Beth Johnson, DOB Nov 10, 1940, MRN [id number]  PCP:  Jolyn Nap, MD   History of Present Illness: Beth Johnson is a 72 y.o. female status post CVA at rehabilitation facility here for evaluation of atrial flutter. Currently on Xarelto 15 mg a day.  I have reviewed Launa Grill note and this is a portion of it: "Pt was found on Thanksgiving in her home confused and with slurred speech, left facial draw and left sided weakness after she did not respond to calls all day. She was admitted as [facility] in [place] that evening for an acute stroke. Assessment concluded it had likely occured in the prior 24-48 hours based on her CT findings and exam. She was also found to be in new onset a.fib.  She had LUE weakness, facial draw, confusion and some recall issues and left peripheral visual field deficits. She made tremendous gains while in the hospital, and at this point has regained all of her sight and most of her LUE strength. Still some ongoing issues with memory and recall.  However, according to her daughter there were already some preceding memory concerns prior to the CVA that had raised the question of an early dementia, and she would like to evaluate this further while her mom is here in [place]. Her mother has been reluctant to discuss or acknowledge this but has admitted to writing notes to herself to remember items and some forgetfulness when driving in unfamiliar places. Her daughter privately shared that she has noted noticeable forgetfulness in conversation, repetitious conversations, a flattening of affect and at times a sense that her mom is ignoring or cannot attend to a conversation. To this point there have been no major incidents or safety issues till the stroke. She did not/could not answer her phone all day on Thanksgiving when she  was home alone till her sister finally reached her that night and called police to the home along with EMS. Daughter states that her driving is restricted to a small repetitive course of familiar places in her hometown. Pt did have some sundowning in the hospital. 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 Her daughter would like to proceed with initiating evals with neurology for CVA follow up and memory issues , as well as a cardiologist. Pt is on Xarelto starting today- 15mg .  Pt has had [facility] full [facility] on which she performed quite well. They don't expect her to need to be there more than a week or so. She had a Berg balance scale and got 55/56. Main PT/OT deficits identified were some balance issues. Has almost full function of and strength in her LUE. There have been no swallowing i or choking ssues. She does have some partial aphasia and word findings issues at times and has been more forgetful , flat and confused at times since the stroke. Her daughter is also concerned she has been depressed as well before the stroke and mentions she and another family member have done well on lexapro.  They hope to transition Ms. Volpe to their home here in [place] while she is unable to drive after  SNF discharge and get home therapy is appropriate with the ultimate goal of restoring her level of functioning such that she can be cleared to drive and return home.She is not a vegetarian. She eats chicken and fish, PB and lunch meat. Diet is fairly repetitious but pretty healthy. No prior hx of anemia..  Atrial Fibrillation F/U:  c/o Palpitations no. She has picked up her scrip for Xarelto 15 mg today. She is not on any rate controlling medications. She has no other complaints and her daughter has  not observed any breathlessness, DOE, chest pain, dizziness or syncope. Pt denies all of the above as well. She did not have any known cardiac hx prior to the stroke that her daughter is aware of."  In May 2014 PCP saw her and dx of AFIB was made according to daughter. Recent ECHO Normal EF. HR mildly elevated in hospital. She currently is asymptomatic in regards to her atrial fibrillation. She does not realize that she is in atrial fibrillation. According to notes, there was a prior history of atrial septal defect that was not noted/seen on her bubble study.  In other words I assume her bubble study did not demonstrate shunt. Injection of contrast documented no intra-atrial shunt. Mild MR/mild TR. There were no significant narrowing of the neck arteries. Stroke occurred in subacute cortical infarction of right frontal convexity. Chest x-ray was unremarkable. Her daughter present for discussion is very integral in her care. Normally her mother resides in [place].   10/24/13 that she has been doing very well, asymptomatic once again. At first, on auscultation she sounded very regular, on repeat however I noticed some mild irregularity, likely flutter pattern once again. Better rate control. She is tolerating medications well. She is going to be going to [place] soon.  Wt Readings from Last 3 Encounters:  10/24/13 118 lb (53.524 kg)  10/20/13 118 lb (53.524 kg)  10/11/13 120 lb (54.432 kg)     Past Medical History  Diagnosis Date  . CVA (cerebral infarction)     new onset A. fib with CVA 09/2013: LUE weakness, left visual field deficit, facial draw, altered cognition  . Atrial fibrillation   . Depression   . Hypothyroidism     Past Surgical History  Procedure Laterality Date  . Abdominal hysterectomy    . Bil hip replacement ( 2 on ? left)    . Appendectomy      Current Outpatient Prescriptions  Medication Sig Dispense Refill  . Calcium Citrate (CITRACAL PO) Take by mouth.      .  Cholecalciferol (VITAMIN D PO) Take 1,000 mg by mouth daily.      Marland Kitchen diltiazem (CARDIZEM CD) 120 MG 24 hr capsule Take 1 capsule (120 mg total) by mouth daily.  30 capsule  4  . donepezil (ARICEPT) 5 MG tablet Take 1 tablet (5 mg total) by mouth at bedtime. Take 1 tablet daily x 4 weeks then 10 mg daily  60 tablet  0  . escitalopram (LEXAPRO) 10 MG tablet Take 10 mg by mouth daily.      Marland Kitchen levothyroxine (SYNTHROID, LEVOTHROID) 50 MCG tablet Take 50 mcg by mouth daily before breakfast.      . Methylfol-Methylcob-Acetylcyst (CEREFOLIN NAC) 6-2-600 MG TABS Take 1 ampule by mouth 1 day or 1 dose.  30 each  0  . Omega-3 Fatty Acids (FISH OIL) 500 MG CAPS Take 500 mg by mouth daily.      . Rivaroxaban (XARELTO) 15 MG TABS tablet Take  15 mg by mouth daily with supper.       No current facility-administered medications for this visit.    Allergies:    Allergies  Allergen Reactions  . Penicillins   . Sulfa Antibiotics     Social History:  The patient  reports that she has never smoked. She does not have any smokeless tobacco history on file. She reports that she drinks alcohol. She reports that she does not use illicit drugs.   Family History  Problem Relation Age of Onset  . Heart attack Father   . CAD Father   . Gallstones Mother   . Cancer Mother     ROS:  Please see the history of present illness.   Positive for stroke, no bleeding, no syncope, no chest pain, no shortness of breath   All other systems reviewed and negative.   PHYSICAL EXAM: VS:  BP 116/70  Pulse 79  Ht 5' 3.5" (1.613 m)  Wt 118 lb (53.524 kg)  BMI 20.57 kg/m2  SpO2 99% Well nourished, well developed, in no acute distress HEENT: normal, [place]/AT, EOMI Neck: no JVD, normal carotid upstroke, no bruit Cardiac: Irregularly irregular, rate 70 no murmur Lungs:  clear to auscultation bilaterally, no wheezing, rhonchi or rales Abd: soft, nontender, no hepatomegaly, no bruits Ext: no edema, 2+ distal pulses Skin: warm and  dry GU: deferred Neuro: no focal abnormalities noted, AAO x 3  EKG:  Atrial fibrillation/flutter heart rate 90, no other abnormalities  ASSESSMENT AND PLAN:  1. Atrial fibrillation-likely permanent, has been in since 5/14, asymptomatic. We will continue with rate control strategy. Low-dose diltiazem CD 120 mg once a day. Her heart rate currently is in the 70s. She is doing well.. We had lengthy discussion about rate versus rhythm control. Given her persistent nature of atrial fibrillation/flutter and asymptomatic response , we will continue with rate control. I explained that it is likely that either cardioversion would be unsuccessful or shortly after cardioversion, atrial fibrillation may return. Also regardless of ability to take her out of atrial fibrillation, she still needs anticoagulation. 2. Chronic anticoagulation-continue with Xarelto.  Currently at 15 mg because of chronic kidney disease. We will continue to monitor. Recent creatinine has been 1.57. 3. History of stroke - this occurred over Thanksgiving, unexpected, likely secondary to atrial fibrillation. They did not wish to take statin therapy. We discussed the pros and cons. Given the likely embolic source and normal LDL, no statin seems like a reasonable approach. 4. We will see her back on as-needed basis as she is getting a new cardiologist in [place]. We'll be happy to supply with any records as needed.   Signed, Donato Schultz, MD [facility]  10/24/2013 2:16 PM

## 2013-11-09 ENCOUNTER — Other Ambulatory Visit: Payer: Self-pay

## 2013-11-09 MED ORDER — DILTIAZEM HCL ER COATED BEADS 120 MG PO CP24: 120.0000 mg | ORAL_CAPSULE | Freq: Every day | ORAL | Status: DC

## 2013-11-14 ENCOUNTER — Telehealth: Payer: Self-pay

## 2013-11-14 DIAGNOSIS — F0280 Dementia in other diseases classified elsewhere without behavioral disturbance: Secondary | ICD-10-CM | POA: Diagnosis not present

## 2013-11-14 DIAGNOSIS — IMO0001 Reserved for inherently not codable concepts without codable children: Secondary | ICD-10-CM | POA: Diagnosis not present

## 2013-11-14 DIAGNOSIS — N183 Chronic kidney disease, stage 3 unspecified: Secondary | ICD-10-CM | POA: Diagnosis not present

## 2013-11-14 DIAGNOSIS — I69920 Aphasia following unspecified cerebrovascular disease: Secondary | ICD-10-CM | POA: Diagnosis not present

## 2013-11-14 DIAGNOSIS — G3184 Mild cognitive impairment, so stated: Secondary | ICD-10-CM | POA: Diagnosis not present

## 2013-11-14 DIAGNOSIS — I129 Hypertensive chronic kidney disease with stage 1 through stage 4 chronic kidney disease, or unspecified chronic kidney disease: Secondary | ICD-10-CM | POA: Diagnosis not present

## 2013-11-14 DIAGNOSIS — G309 Alzheimer's disease, unspecified: Secondary | ICD-10-CM | POA: Diagnosis not present

## 2013-11-14 MED ORDER — DONEPEZIL HCL 10 MG PO TABS
10.0000 mg | ORAL_TABLET | Freq: Every day | ORAL | Status: DC
Start: 2013-11-14 — End: 2014-02-03

## 2013-11-14 NOTE — Telephone Encounter (Signed)
Florentina Jenny called requesting we sent Aricept to [facility].  Since patient is now on 10mg  dose, she asked that it be sent for 10mg  tabs rather than two 5mg  to try and help the patient because at times she gets confused about her pills.

## 2013-11-16 DIAGNOSIS — G3184 Mild cognitive impairment, so stated: Secondary | ICD-10-CM | POA: Diagnosis not present

## 2013-11-16 DIAGNOSIS — I69920 Aphasia following unspecified cerebrovascular disease: Secondary | ICD-10-CM | POA: Diagnosis not present

## 2013-11-16 DIAGNOSIS — I129 Hypertensive chronic kidney disease with stage 1 through stage 4 chronic kidney disease, or unspecified chronic kidney disease: Secondary | ICD-10-CM | POA: Diagnosis not present

## 2013-11-16 DIAGNOSIS — N183 Chronic kidney disease, stage 3 unspecified: Secondary | ICD-10-CM | POA: Diagnosis not present

## 2013-11-16 DIAGNOSIS — IMO0001 Reserved for inherently not codable concepts without codable children: Secondary | ICD-10-CM | POA: Diagnosis not present

## 2013-11-16 DIAGNOSIS — F0280 Dementia in other diseases classified elsewhere without behavioral disturbance: Secondary | ICD-10-CM | POA: Diagnosis not present

## 2013-11-17 DIAGNOSIS — R9431 Abnormal electrocardiogram [ECG] [EKG]: Secondary | ICD-10-CM | POA: Diagnosis not present

## 2013-11-17 DIAGNOSIS — I4891 Unspecified atrial fibrillation: Secondary | ICD-10-CM | POA: Diagnosis not present

## 2013-11-21 DIAGNOSIS — N183 Chronic kidney disease, stage 3 unspecified: Secondary | ICD-10-CM | POA: Diagnosis not present

## 2013-11-21 DIAGNOSIS — I129 Hypertensive chronic kidney disease with stage 1 through stage 4 chronic kidney disease, or unspecified chronic kidney disease: Secondary | ICD-10-CM | POA: Diagnosis not present

## 2013-11-21 DIAGNOSIS — IMO0001 Reserved for inherently not codable concepts without codable children: Secondary | ICD-10-CM | POA: Diagnosis not present

## 2013-11-21 DIAGNOSIS — F0280 Dementia in other diseases classified elsewhere without behavioral disturbance: Secondary | ICD-10-CM | POA: Diagnosis not present

## 2013-11-21 DIAGNOSIS — G3184 Mild cognitive impairment, so stated: Secondary | ICD-10-CM | POA: Diagnosis not present

## 2013-11-21 DIAGNOSIS — I69920 Aphasia following unspecified cerebrovascular disease: Secondary | ICD-10-CM | POA: Diagnosis not present

## 2013-11-23 DIAGNOSIS — I129 Hypertensive chronic kidney disease with stage 1 through stage 4 chronic kidney disease, or unspecified chronic kidney disease: Secondary | ICD-10-CM | POA: Diagnosis not present

## 2013-11-23 DIAGNOSIS — IMO0001 Reserved for inherently not codable concepts without codable children: Secondary | ICD-10-CM | POA: Diagnosis not present

## 2013-11-23 DIAGNOSIS — I69920 Aphasia following unspecified cerebrovascular disease: Secondary | ICD-10-CM | POA: Diagnosis not present

## 2013-11-23 DIAGNOSIS — F0280 Dementia in other diseases classified elsewhere without behavioral disturbance: Secondary | ICD-10-CM | POA: Diagnosis not present

## 2013-11-23 DIAGNOSIS — N183 Chronic kidney disease, stage 3 unspecified: Secondary | ICD-10-CM | POA: Diagnosis not present

## 2013-11-23 DIAGNOSIS — G3184 Mild cognitive impairment, so stated: Secondary | ICD-10-CM | POA: Diagnosis not present

## 2013-11-28 ENCOUNTER — Other Ambulatory Visit: Payer: Self-pay

## 2013-11-28 MED ORDER — DILTIAZEM HCL ER COATED BEADS 120 MG PO CP24: 120.0000 mg | ORAL_CAPSULE | Freq: Every day | ORAL | Status: DC

## 2013-11-29 DIAGNOSIS — IMO0001 Reserved for inherently not codable concepts without codable children: Secondary | ICD-10-CM | POA: Diagnosis not present

## 2013-11-29 DIAGNOSIS — I69920 Aphasia following unspecified cerebrovascular disease: Secondary | ICD-10-CM | POA: Diagnosis not present

## 2013-11-29 DIAGNOSIS — F0280 Dementia in other diseases classified elsewhere without behavioral disturbance: Secondary | ICD-10-CM | POA: Diagnosis not present

## 2013-11-29 DIAGNOSIS — N183 Chronic kidney disease, stage 3 unspecified: Secondary | ICD-10-CM | POA: Diagnosis not present

## 2013-11-29 DIAGNOSIS — G3184 Mild cognitive impairment, so stated: Secondary | ICD-10-CM | POA: Diagnosis not present

## 2013-11-29 DIAGNOSIS — G309 Alzheimer's disease, unspecified: Secondary | ICD-10-CM | POA: Diagnosis not present

## 2013-11-29 DIAGNOSIS — I129 Hypertensive chronic kidney disease with stage 1 through stage 4 chronic kidney disease, or unspecified chronic kidney disease: Secondary | ICD-10-CM | POA: Diagnosis not present

## 2013-12-01 DIAGNOSIS — IMO0001 Reserved for inherently not codable concepts without codable children: Secondary | ICD-10-CM | POA: Diagnosis not present

## 2013-12-01 DIAGNOSIS — G3184 Mild cognitive impairment, so stated: Secondary | ICD-10-CM | POA: Diagnosis not present

## 2013-12-01 DIAGNOSIS — F0280 Dementia in other diseases classified elsewhere without behavioral disturbance: Secondary | ICD-10-CM | POA: Diagnosis not present

## 2013-12-01 DIAGNOSIS — G309 Alzheimer's disease, unspecified: Secondary | ICD-10-CM | POA: Diagnosis not present

## 2013-12-01 DIAGNOSIS — N183 Chronic kidney disease, stage 3 unspecified: Secondary | ICD-10-CM | POA: Diagnosis not present

## 2013-12-01 DIAGNOSIS — I69920 Aphasia following unspecified cerebrovascular disease: Secondary | ICD-10-CM | POA: Diagnosis not present

## 2013-12-01 DIAGNOSIS — I129 Hypertensive chronic kidney disease with stage 1 through stage 4 chronic kidney disease, or unspecified chronic kidney disease: Secondary | ICD-10-CM | POA: Diagnosis not present

## 2013-12-06 DIAGNOSIS — F0280 Dementia in other diseases classified elsewhere without behavioral disturbance: Secondary | ICD-10-CM | POA: Diagnosis not present

## 2013-12-06 DIAGNOSIS — G3184 Mild cognitive impairment, so stated: Secondary | ICD-10-CM | POA: Diagnosis not present

## 2013-12-06 DIAGNOSIS — N183 Chronic kidney disease, stage 3 unspecified: Secondary | ICD-10-CM | POA: Diagnosis not present

## 2013-12-06 DIAGNOSIS — I129 Hypertensive chronic kidney disease with stage 1 through stage 4 chronic kidney disease, or unspecified chronic kidney disease: Secondary | ICD-10-CM | POA: Diagnosis not present

## 2013-12-06 DIAGNOSIS — I69920 Aphasia following unspecified cerebrovascular disease: Secondary | ICD-10-CM | POA: Diagnosis not present

## 2013-12-06 DIAGNOSIS — IMO0001 Reserved for inherently not codable concepts without codable children: Secondary | ICD-10-CM | POA: Diagnosis not present

## 2013-12-07 DIAGNOSIS — I4891 Unspecified atrial fibrillation: Secondary | ICD-10-CM | POA: Diagnosis not present

## 2013-12-07 DIAGNOSIS — I4892 Unspecified atrial flutter: Secondary | ICD-10-CM | POA: Diagnosis not present

## 2013-12-07 DIAGNOSIS — R9431 Abnormal electrocardiogram [ECG] [EKG]: Secondary | ICD-10-CM | POA: Diagnosis not present

## 2013-12-10 DIAGNOSIS — K625 Hemorrhage of anus and rectum: Secondary | ICD-10-CM | POA: Diagnosis not present

## 2013-12-12 DIAGNOSIS — K625 Hemorrhage of anus and rectum: Secondary | ICD-10-CM | POA: Diagnosis not present

## 2013-12-13 DIAGNOSIS — F0280 Dementia in other diseases classified elsewhere without behavioral disturbance: Secondary | ICD-10-CM | POA: Diagnosis not present

## 2013-12-13 DIAGNOSIS — G309 Alzheimer's disease, unspecified: Secondary | ICD-10-CM | POA: Diagnosis not present

## 2013-12-13 DIAGNOSIS — N183 Chronic kidney disease, stage 3 unspecified: Secondary | ICD-10-CM | POA: Diagnosis not present

## 2013-12-13 DIAGNOSIS — I69920 Aphasia following unspecified cerebrovascular disease: Secondary | ICD-10-CM | POA: Diagnosis not present

## 2013-12-13 DIAGNOSIS — I129 Hypertensive chronic kidney disease with stage 1 through stage 4 chronic kidney disease, or unspecified chronic kidney disease: Secondary | ICD-10-CM | POA: Diagnosis not present

## 2013-12-13 DIAGNOSIS — G3184 Mild cognitive impairment, so stated: Secondary | ICD-10-CM | POA: Diagnosis not present

## 2013-12-13 DIAGNOSIS — IMO0001 Reserved for inherently not codable concepts without codable children: Secondary | ICD-10-CM | POA: Diagnosis not present

## 2013-12-15 DIAGNOSIS — F0280 Dementia in other diseases classified elsewhere without behavioral disturbance: Secondary | ICD-10-CM | POA: Diagnosis not present

## 2013-12-15 DIAGNOSIS — G3184 Mild cognitive impairment, so stated: Secondary | ICD-10-CM | POA: Diagnosis not present

## 2013-12-15 DIAGNOSIS — I129 Hypertensive chronic kidney disease with stage 1 through stage 4 chronic kidney disease, or unspecified chronic kidney disease: Secondary | ICD-10-CM | POA: Diagnosis not present

## 2013-12-15 DIAGNOSIS — I69920 Aphasia following unspecified cerebrovascular disease: Secondary | ICD-10-CM | POA: Diagnosis not present

## 2013-12-15 DIAGNOSIS — N183 Chronic kidney disease, stage 3 unspecified: Secondary | ICD-10-CM | POA: Diagnosis not present

## 2013-12-15 DIAGNOSIS — IMO0001 Reserved for inherently not codable concepts without codable children: Secondary | ICD-10-CM | POA: Diagnosis not present

## 2013-12-22 DIAGNOSIS — I69920 Aphasia following unspecified cerebrovascular disease: Secondary | ICD-10-CM | POA: Diagnosis not present

## 2013-12-22 DIAGNOSIS — IMO0001 Reserved for inherently not codable concepts without codable children: Secondary | ICD-10-CM | POA: Diagnosis not present

## 2013-12-22 DIAGNOSIS — G3184 Mild cognitive impairment, so stated: Secondary | ICD-10-CM | POA: Diagnosis not present

## 2013-12-22 DIAGNOSIS — F0280 Dementia in other diseases classified elsewhere without behavioral disturbance: Secondary | ICD-10-CM | POA: Diagnosis not present

## 2013-12-22 DIAGNOSIS — N183 Chronic kidney disease, stage 3 unspecified: Secondary | ICD-10-CM | POA: Diagnosis not present

## 2013-12-22 DIAGNOSIS — I129 Hypertensive chronic kidney disease with stage 1 through stage 4 chronic kidney disease, or unspecified chronic kidney disease: Secondary | ICD-10-CM | POA: Diagnosis not present

## 2013-12-26 DIAGNOSIS — F0280 Dementia in other diseases classified elsewhere without behavioral disturbance: Secondary | ICD-10-CM | POA: Diagnosis not present

## 2013-12-26 DIAGNOSIS — I69920 Aphasia following unspecified cerebrovascular disease: Secondary | ICD-10-CM | POA: Diagnosis not present

## 2013-12-26 DIAGNOSIS — G3184 Mild cognitive impairment, so stated: Secondary | ICD-10-CM | POA: Diagnosis not present

## 2013-12-26 DIAGNOSIS — IMO0001 Reserved for inherently not codable concepts without codable children: Secondary | ICD-10-CM | POA: Diagnosis not present

## 2013-12-26 DIAGNOSIS — G309 Alzheimer's disease, unspecified: Secondary | ICD-10-CM | POA: Diagnosis not present

## 2013-12-26 DIAGNOSIS — I129 Hypertensive chronic kidney disease with stage 1 through stage 4 chronic kidney disease, or unspecified chronic kidney disease: Secondary | ICD-10-CM | POA: Diagnosis not present

## 2013-12-26 DIAGNOSIS — N183 Chronic kidney disease, stage 3 unspecified: Secondary | ICD-10-CM | POA: Diagnosis not present

## 2014-01-02 DIAGNOSIS — R413 Other amnesia: Secondary | ICD-10-CM | POA: Diagnosis not present

## 2014-01-02 DIAGNOSIS — R1013 Epigastric pain: Secondary | ICD-10-CM | POA: Diagnosis not present

## 2014-01-02 DIAGNOSIS — E039 Hypothyroidism, unspecified: Secondary | ICD-10-CM | POA: Diagnosis not present

## 2014-01-02 DIAGNOSIS — G3184 Mild cognitive impairment, so stated: Secondary | ICD-10-CM | POA: Diagnosis not present

## 2014-01-18 DIAGNOSIS — K5520 Angiodysplasia of colon without hemorrhage: Secondary | ICD-10-CM | POA: Diagnosis not present

## 2014-01-18 DIAGNOSIS — R195 Other fecal abnormalities: Secondary | ICD-10-CM | POA: Diagnosis not present

## 2014-01-18 DIAGNOSIS — R1013 Epigastric pain: Secondary | ICD-10-CM | POA: Diagnosis not present

## 2014-01-18 DIAGNOSIS — K625 Hemorrhage of anus and rectum: Secondary | ICD-10-CM | POA: Diagnosis not present

## 2014-01-23 DIAGNOSIS — K2970 Gastritis, unspecified, without bleeding: Secondary | ICD-10-CM | POA: Diagnosis not present

## 2014-01-23 DIAGNOSIS — R1013 Epigastric pain: Secondary | ICD-10-CM | POA: Diagnosis not present

## 2014-01-23 DIAGNOSIS — K319 Disease of stomach and duodenum, unspecified: Secondary | ICD-10-CM | POA: Diagnosis not present

## 2014-01-23 DIAGNOSIS — K2940 Chronic atrophic gastritis without bleeding: Secondary | ICD-10-CM | POA: Diagnosis not present

## 2014-01-27 DIAGNOSIS — K8020 Calculus of gallbladder without cholecystitis without obstruction: Secondary | ICD-10-CM | POA: Diagnosis not present

## 2014-01-27 DIAGNOSIS — N289 Disorder of kidney and ureter, unspecified: Secondary | ICD-10-CM | POA: Diagnosis not present

## 2014-02-01 ENCOUNTER — Encounter (INDEPENDENT_AMBULATORY_CARE_PROVIDER_SITE_OTHER): Payer: Self-pay | Admitting: Surgery

## 2014-02-02 ENCOUNTER — Encounter (INDEPENDENT_AMBULATORY_CARE_PROVIDER_SITE_OTHER): Payer: Self-pay | Admitting: Surgery

## 2014-02-02 ENCOUNTER — Ambulatory Visit (INDEPENDENT_AMBULATORY_CARE_PROVIDER_SITE_OTHER): Payer: Medicare Other | Admitting: Surgery

## 2014-02-02 VITALS — BP 118/70 | HR 72 | Temp 98.4°F | Ht 62.0 in | Wt 124.6 lb

## 2014-02-02 DIAGNOSIS — K8010 Calculus of gallbladder with chronic cholecystitis without obstruction: Secondary | ICD-10-CM | POA: Insufficient documentation

## 2014-02-02 NOTE — Progress Notes (Signed)
Patient ID: Beth Johnson, female   DOB: [birth year], 73 y.o.   MRN: [id number]  Chief Complaint  Patient presents with  . eval gallbladder    HPI Beth Johnson is a 73 y.o. female.  PCP - Dr. Esmeralda Johnson - [place], [place] GI - Dr. 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 She has chronic atrial fibrillation and is on Xarelto.  She has almost recovered completely from her stroke. Over the last several months she has developed increasing epigastric and right upper quadrant abdominal pain. This tends to be worse in the evening. She denies any nausea or vomiting. She does have occasional diarrhea. Sometimes her symptoms get worse when she has a salad with dressing. She underwent workup by GI at [facility] in [place], [place]. The GI physician performed an upper endoscopy which was significant only for some mild nonerosive gastritis. She then underwent an ultrasound performed on 01/27/14 that showed 2 gallstones within the gallbladder but no subtle wall thickening. The common bile duct was normal. Her liver function tests showed a total bilirubin of 0.6 alkaline phosphatase elevated at 201 AST 38 ALT slightly elevated at 50 and lipase 44. Her daughter lives here in [place] so she is now referred to [place] for surgery.  She has seen Dr. Candee Furbish of cardiology here in [place] in the past. She is rate controlled with diltiazem for her chronic atrial fibrillation.  Past Medical History  Diagnosis Date  . CVA (cerebral infarction)     new onset A. fib with CVA 09/2013: LUE weakness, left visual field deficit, facial draw, altered cognition  . Atrial fibrillation   . Depression   . Hypothyroidism     Past Surgical History  Procedure Laterality Date  . Abdominal hysterectomy    . Bil hip replacement (  2 on ? left)    . Appendectomy      Family History  Problem Relation Age of Onset  . Heart attack Father   . CAD Father   . Gallstones Mother   . Cancer Mother     Social History History  Substance Use Topics  . Smoking status: Never Smoker   . Smokeless tobacco: Not on file  . Alcohol Use: Yes    Allergies  Allergen Reactions  . Penicillins   . Sulfa Antibiotics     Current Outpatient Prescriptions  Medication Sig Dispense Refill  . Calcium Citrate (CITRACAL PO) Take by mouth.      . Cholecalciferol (VITAMIN D PO) Take 1,000 mg by mouth daily.      Marland Kitchen diltiazem (CARDIZEM CD) 120 MG 24 hr capsule Take 1 capsule (120 mg total) by mouth daily.  90 capsule  4  . donepezil (ARICEPT) 10 MG tablet Take 1 tablet (10 mg total) by mouth at bedtime.  90 tablet  1  . escitalopram (LEXAPRO) 10 MG tablet Take 10 mg by mouth daily.      Marland Kitchen levothyroxine (SYNTHROID, LEVOTHROID) 50 MCG tablet Take 50 mcg by mouth daily before breakfast.      . Methylfol-Methylcob-Acetylcyst (CEREFOLIN NAC) 6-2-600 MG TABS Take 1 tablet by mouth daily.  90 each  1  . Omega-3 Fatty Acids (FISH OIL) 500 MG CAPS Take 500 mg by mouth daily.      Marland Kitchen omeprazole (PRILOSEC) 40 MG capsule       .  Rivaroxaban (XARELTO) 15 MG TABS tablet Take 15 mg by mouth daily with supper.       No current facility-administered medications for this visit.    Review of Systems Review of Systems  Constitutional: Negative for fever, chills and unexpected weight change.  HENT: Negative for congestion, hearing loss, sore throat, trouble swallowing and voice change.   Eyes: Negative for visual disturbance.  Respiratory: Negative for cough and wheezing.   Cardiovascular: Negative for chest pain, palpitations and leg swelling.  Gastrointestinal: Positive for abdominal pain, diarrhea and abdominal distention. Negative for nausea, vomiting, constipation, blood in stool and anal bleeding.  Genitourinary: Negative for hematuria, vaginal  bleeding and difficulty urinating.  Musculoskeletal: Negative for arthralgias.  Skin: Negative for rash and wound.  Neurological: Negative for seizures, syncope and headaches.  Hematological: Negative for adenopathy. Does not bruise/bleed easily.  Psychiatric/Behavioral: Negative for confusion.    Blood pressure 118/70, pulse 72, temperature 98.4 F (36.9 C), temperature source Oral, height 5\' 2"  (1.575 m), weight 124 lb 9.6 oz (56.518 kg).  Physical Exam Physical Exam WDWN in NAD HEENT:  EOMI, sclera anicteric Neck:  No masses, no thyromegaly Lungs:  CTA bilaterally; normal respiratory effort CV:  Regular rate and rhythm; no murmurs Abd:  +bowel sounds, soft, mildly tender in epigastrium; no palpable masses Ext:  Well-perfused; no edema Skin:  Warm, dry; no sign of jaundice  Data Reviewed Labs, Ultrasound, EGD report from [place]  Assessment    Chronic calculus cholecystitis Chronic atrial fibrillation - rate-controlled on Diltiazem     Plan    Laparoscopic cholecystectomy with intraoperative cholangiogram.  The surgical procedure has been discussed with the patient.  Potential risks, benefits, alternative treatments, and expected outcomes have been explained.  All of the patient's questions at this time have been answered.  The likelihood of reaching the patient's treatment goal is good.  The patient understand the proposed surgical procedure and wishes to proceed. Hold Xarelto.  I have contacted Dr. Marlou Porch to make sure that he has no concerns about her surgery.        [name] K. 02/02/2014, 12:43 PM

## 2014-02-02 NOTE — Pre-Procedure Instructions (Signed)
Dayle Sherpa  02/02/2014   Your procedure is scheduled on:  Monday February 06, 2014 at 11:00 AM.  Report to [facility] Short Stay Entrance "A"  Admitting at 9:00 AM.  Call this number if you have problems the morning of surgery: [phone]   Remember:   Do not eat food or drink liquids after midnight.   Take these medicines the morning of surgery with A SIP OF WATER:  Escitalopram (Lexapro), Omeprazole (Prilosec), and Donepezil (Aricept)   Do not wear jewelry, make-up or nail polish.  Do not wear lotions, powders, or perfumes. You may wear deodorant.  Do not shave 48 hours prior to surgery.   Do not bring valuables to the hospital.  [facility] is not responsible for any belongings or valuables.               Contacts, dentures or bridgework may not be worn into surgery.  Leave suitcase in the car. After surgery it may be brought to your room.  For patients admitted to the hospital, discharge time is determined by your treatment team.               Patients discharged the day of surgery will not be allowed to drive home.  Name and phone number of your driver: Family/Friend  Special Instructions: Shower using CHG soap the night before and the morning of your surgery   Please read over the following fact sheets that you were given: Pain Booklet, Coughing and Deep Breathing and Surgical Site Infection Prevention

## 2014-02-03 ENCOUNTER — Ambulatory Visit (HOSPITAL_COMMUNITY)
Admission: RE | Admit: 2014-02-03 | Discharge: 2014-02-03 | Disposition: A | Discharge status: Home / Self Care | Payer: Medicare Other | Source: Ambulatory Visit | Attending: Anesthesiology | Admitting: Anesthesiology

## 2014-02-03 ENCOUNTER — Encounter (HOSPITAL_COMMUNITY): Payer: Self-pay

## 2014-02-03 ENCOUNTER — Encounter (HOSPITAL_COMMUNITY)
Admission: RE | Admit: 2014-02-03 | Discharge: 2014-02-03 | Disposition: A | Discharge status: Home / Self Care | Payer: Medicare Other | Source: Ambulatory Visit | Attending: Surgery | Admitting: Surgery

## 2014-02-03 DIAGNOSIS — Z01818 Encounter for other preprocedural examination: Secondary | ICD-10-CM | POA: Diagnosis not present

## 2014-02-03 DIAGNOSIS — Z01812 Encounter for preprocedural laboratory examination: Secondary | ICD-10-CM | POA: Insufficient documentation

## 2014-02-03 HISTORY — DX: Cardiac arrhythmia, unspecified: I49.9

## 2014-02-03 HISTORY — DX: Gastro-esophageal reflux disease without esophagitis: K21.9

## 2014-02-03 HISTORY — DX: Cerebral infarction, unspecified: I63.9

## 2014-02-03 LAB — BASIC METABOLIC PANEL
BUN: 16 mg/dL (ref 6–23)
CALCIUM: 9.1 mg/dL (ref 8.4–10.5)
CO2: 24 meq/L (ref 19–32)
Chloride: 102 mEq/L (ref 96–112)
Creatinine, Ser: 1.46 mg/dL — ABNORMAL HIGH (ref 0.50–1.10)
GFR calc Af Amer: 40 mL/min — ABNORMAL LOW (ref >90)
GFR, EST NON AFRICAN AMERICAN: 34 mL/min — AB (ref >90)
Glucose, Bld: 74 mg/dL (ref 70–99)
Potassium: 4.4 mEq/L (ref 3.7–5.3)
SODIUM: 139 meq/L (ref 137–147)

## 2014-02-03 LAB — CBC
HCT: 31.1 % — ABNORMAL LOW (ref 36.0–46.0)
Hemoglobin: 10 g/dL — ABNORMAL LOW (ref 12.0–15.0)
MCH: 30.1 pg (ref 26.0–34.0)
MCHC: 32.2 g/dL (ref 30.0–36.0)
MCV: 93.7 fL (ref 78.0–100.0)
PLATELETS: 376 10*3/uL (ref 150–400)
RBC: 3.32 MIL/uL — AB (ref 3.87–5.11)
RDW: 13.1 % (ref 11.5–15.5)
WBC: 6.4 10*3/uL (ref 4.0–10.5)

## 2014-02-03 NOTE — Progress Notes (Signed)
Anesthesia chart review: Patient is a 73 year old female scheduled for laparoscopic cholecystectomy on 02/06/14 by Dr. Georgette Dover.   History includes atrial fibrillation, CVA 09/29/13 with LUE hemiparesis, non-smoker, hypothyroidism, depression, GERD, bilateral THA, hysterectomy, appendectomy. Previous hospital records scanned under media tab also mention CKD stage III. Cardiologist is Dr. Candee Furbish while in [place], with plans to get reestablished with a cardiologist is Baldo Ash when she returns.  Currently, she is in [place] with her daughter. PCP is Dr. Christeen Douglas with [facility]. She has seen Carlos Levering, PA-C with [facility] while in [place]. Dr. Georgette Dover did contact Dr. Marlou Porch about plans for surgery and need to hold Xarelto starting 02/02/14.  EKG on 10/11/13 showed aflutter, non-specific T wave abnormality. HR was 76 bpm today.  Echo on 09/30/13 [facility]; scanned under Media tab) showed: No cardiac source of emboli noted. Normal LV systolic function. No significant valvular disease. EF equal to or greater than 55%. The left atrium is moderately dilated. The right atrium is mildly dilated. Injection of contrast documented no intraatrial shunt. Mild mitral regurgitation. Mild to moderate tricuspid regurgitation. Doppler findings do not suggest pulmonary hypertension. Trace pulmonic valvular regurgitation.  CTA of the head and neck on 10/01/13 showed: No significant narrowing of the neck arteries. Small weblike filling defect in the origin of the left ICA possibly representing a linear plaque within the lumen. This does not cause significant narrowing or luminal irregularity. This could be somewhat further characterized with carotid duplex ultrasound and careful grayscale imaging. Intracranial internal carotid arteries, vertebral arteries, basilar artery are patent without significant stenosis or luminal irregularity. The anterior, middle, and posterior cerebral arteries  are patent. There are no aneurysms detected. No large vessel intra-cranial arterial stenosis or intraluminal filling defect.  CXR on 02/03/14 showed: Blunting of the costophrenic angles bilaterally which may be due to a small amount of bilateral pleural fluid versus pleural parenchymal scarring.  Preoperative labs noted.  Cr 1.46 (Cr 10/01/13 was 1.36), H/H 10.0/31.1. Glucose 74. There is an order for PTT on the day of surgery.  If no acute changes then I anticipate that she can proceed as planned.  George Hugh [facility] Short Stay Center/Anesthesiology Phone [phone] 02/03/2014 1:49 PM

## 2014-02-03 NOTE — Progress Notes (Signed)
Cardiologist is Dr. Candee Furbish and LOV was in December 2014. Patient denied having a stress test, cardiac cath or sleep study.  PCP is Dr. Esmeralda Arthur in [place], [place]. Neurologist is Dr. Leonie Man. When asked about Xarelto patient and patients son in law informed Nurse that she stopped Xarelto yesterday 02/02/14. Will order PTT DOS per anesthesia orders. Patient denied having any acute cardiac or pulmonary issues. During PAT appointment patient was able to answer most of questions asked, however patients son in law did help with medications.

## 2014-02-03 NOTE — Progress Notes (Signed)
No orders in EPIC and [facility] is currently closed due to the Easter holiday.

## 2014-02-06 ENCOUNTER — Ambulatory Visit (HOSPITAL_COMMUNITY): Payer: Medicare Other | Admitting: Certified Registered Nurse Anesthetist

## 2014-02-06 ENCOUNTER — Encounter (HOSPITAL_COMMUNITY): Payer: Self-pay | Admitting: Certified Registered Nurse Anesthetist

## 2014-02-06 ENCOUNTER — Ambulatory Visit (HOSPITAL_COMMUNITY): Payer: Medicare Other

## 2014-02-06 ENCOUNTER — Encounter (HOSPITAL_COMMUNITY): Payer: Medicare Other | Admitting: Vascular Surgery

## 2014-02-06 ENCOUNTER — Observation Stay (HOSPITAL_COMMUNITY)
Admission: RE | Admit: 2014-02-06 | Discharge: 2014-02-07 | Disposition: A | Discharge status: Home / Self Care | Payer: Medicare Other | Source: Ambulatory Visit | Attending: Surgery | Admitting: Surgery

## 2014-02-06 ENCOUNTER — Encounter (HOSPITAL_COMMUNITY)
Admission: RE | Disposition: A | Discharge status: Home / Self Care | Payer: Self-pay | Source: Ambulatory Visit | Attending: Surgery

## 2014-02-06 DIAGNOSIS — E7800 Pure hypercholesterolemia, unspecified: Secondary | ICD-10-CM | POA: Insufficient documentation

## 2014-02-06 DIAGNOSIS — F329 Major depressive disorder, single episode, unspecified: Secondary | ICD-10-CM | POA: Diagnosis not present

## 2014-02-06 DIAGNOSIS — Z7901 Long term (current) use of anticoagulants: Secondary | ICD-10-CM | POA: Insufficient documentation

## 2014-02-06 DIAGNOSIS — E039 Hypothyroidism, unspecified: Secondary | ICD-10-CM | POA: Diagnosis not present

## 2014-02-06 DIAGNOSIS — I129 Hypertensive chronic kidney disease with stage 1 through stage 4 chronic kidney disease, or unspecified chronic kidney disease: Secondary | ICD-10-CM | POA: Insufficient documentation

## 2014-02-06 DIAGNOSIS — K8010 Calculus of gallbladder with chronic cholecystitis without obstruction: Principal | ICD-10-CM | POA: Insufficient documentation

## 2014-02-06 DIAGNOSIS — G309 Alzheimer's disease, unspecified: Secondary | ICD-10-CM | POA: Diagnosis not present

## 2014-02-06 DIAGNOSIS — F3289 Other specified depressive episodes: Secondary | ICD-10-CM | POA: Diagnosis not present

## 2014-02-06 DIAGNOSIS — K811 Chronic cholecystitis: Secondary | ICD-10-CM | POA: Diagnosis not present

## 2014-02-06 DIAGNOSIS — N189 Chronic kidney disease, unspecified: Secondary | ICD-10-CM | POA: Diagnosis not present

## 2014-02-06 DIAGNOSIS — F0280 Dementia in other diseases classified elsewhere without behavioral disturbance: Secondary | ICD-10-CM | POA: Insufficient documentation

## 2014-02-06 DIAGNOSIS — K219 Gastro-esophageal reflux disease without esophagitis: Secondary | ICD-10-CM | POA: Insufficient documentation

## 2014-02-06 DIAGNOSIS — I4892 Unspecified atrial flutter: Secondary | ICD-10-CM | POA: Insufficient documentation

## 2014-02-06 DIAGNOSIS — R339 Retention of urine, unspecified: Secondary | ICD-10-CM | POA: Diagnosis not present

## 2014-02-06 DIAGNOSIS — Z8673 Personal history of transient ischemic attack (TIA), and cerebral infarction without residual deficits: Secondary | ICD-10-CM | POA: Insufficient documentation

## 2014-02-06 DIAGNOSIS — K819 Cholecystitis, unspecified: Secondary | ICD-10-CM | POA: Diagnosis not present

## 2014-02-06 DIAGNOSIS — Z9049 Acquired absence of other specified parts of digestive tract: Secondary | ICD-10-CM

## 2014-02-06 DIAGNOSIS — I4891 Unspecified atrial fibrillation: Secondary | ICD-10-CM | POA: Diagnosis not present

## 2014-02-06 HISTORY — PX: CHOLECYSTECTOMY: SHX55

## 2014-02-06 LAB — APTT: APTT: 27 s (ref 24–37)

## 2014-02-06 SURGERY — LAPAROSCOPIC CHOLECYSTECTOMY WITH INTRAOPERATIVE CHOLANGIOGRAM
Anesthesia: General | Site: Abdomen

## 2014-02-06 MED ORDER — SUCCINYLCHOLINE CHLORIDE 20 MG/ML IJ SOLN
INTRAMUSCULAR | Status: AC
  Filled 2014-02-06: qty 2

## 2014-02-06 MED ORDER — NEOSTIGMINE METHYLSULFATE 1 MG/ML IJ SOLN
INTRAMUSCULAR | Status: DC | PRN
  Administered 2014-02-06: 3 mg via INTRAVENOUS

## 2014-02-06 MED ORDER — SODIUM CHLORIDE 0.9 % IV SOLN
INTRAVENOUS | Status: DC
  Administered 2014-02-06: 20:00:00 via INTRAVENOUS

## 2014-02-06 MED ORDER — ONDANSETRON HCL 4 MG/2ML IJ SOLN: 4.0000 mg | Freq: Four times a day (QID) | INTRAMUSCULAR | Status: DC | PRN

## 2014-02-06 MED ORDER — SODIUM CHLORIDE 0.9 % IR SOLN
Status: DC | PRN
  Administered 2014-02-06: 1

## 2014-02-06 MED ORDER — FENTANYL CITRATE 0.05 MG/ML IJ SOLN
INTRAMUSCULAR | Status: AC
Start: 2014-02-06 — End: 2014-02-07
  Filled 2014-02-06: qty 2

## 2014-02-06 MED ORDER — ROCURONIUM BROMIDE 50 MG/5ML IV SOLN
INTRAVENOUS | Status: AC
  Filled 2014-02-06: qty 1

## 2014-02-06 MED ORDER — FENTANYL CITRATE 0.05 MG/ML IJ SOLN
25.0000 ug | INTRAMUSCULAR | Status: DC | PRN
  Administered 2014-02-06 (×2): 25 ug via INTRAVENOUS
  Administered 2014-02-06 (×2): 50 ug via INTRAVENOUS

## 2014-02-06 MED ORDER — MIDAZOLAM HCL 2 MG/2ML IJ SOLN
INTRAMUSCULAR | Status: AC
  Filled 2014-02-06: qty 2

## 2014-02-06 MED ORDER — ONDANSETRON 4 MG PO TBDP: 4.0000 mg | ORAL_TABLET | Freq: Three times a day (TID) | ORAL | Status: DC | PRN

## 2014-02-06 MED ORDER — LIDOCAINE HCL (CARDIAC) 20 MG/ML IV SOLN
INTRAVENOUS | Status: AC
  Filled 2014-02-06: qty 5

## 2014-02-06 MED ORDER — STERILE WATER FOR INJECTION IJ SOLN
INTRAMUSCULAR | Status: AC
  Filled 2014-02-06: qty 10

## 2014-02-06 MED ORDER — HYDROCODONE-ACETAMINOPHEN 5-325 MG PO TABS: 1.0000 | ORAL_TABLET | ORAL | Status: DC | PRN

## 2014-02-06 MED ORDER — LACTATED RINGERS IV SOLN
INTRAVENOUS | Status: DC | PRN
  Administered 2014-02-06: 10:00:00 via INTRAVENOUS

## 2014-02-06 MED ORDER — SODIUM CHLORIDE 0.9 % IV SOLN: INTRAVENOUS | Status: DC

## 2014-02-06 MED ORDER — LIDOCAINE HCL (CARDIAC) 20 MG/ML IV SOLN
INTRAVENOUS | Status: DC | PRN
  Administered 2014-02-06: 70 mg via INTRAVENOUS

## 2014-02-06 MED ORDER — BUPIVACAINE-EPINEPHRINE (PF) 0.25% -1:200000 IJ SOLN
INTRAMUSCULAR | Status: AC
  Filled 2014-02-06: qty 30

## 2014-02-06 MED ORDER — PROPOFOL 10 MG/ML IV BOLUS
INTRAVENOUS | Status: AC
  Filled 2014-02-06: qty 20

## 2014-02-06 MED ORDER — SODIUM CHLORIDE 0.9 % IV SOLN
INTRAVENOUS | Status: DC | PRN
  Administered 2014-02-06: 12:00:00

## 2014-02-06 MED ORDER — 0.9 % SODIUM CHLORIDE (POUR BTL) OPTIME
TOPICAL | Status: DC | PRN
  Administered 2014-02-06: 1000 mL

## 2014-02-06 MED ORDER — NEOSTIGMINE METHYLSULFATE 1 MG/ML IJ SOLN
INTRAMUSCULAR | Status: AC
  Filled 2014-02-06: qty 10

## 2014-02-06 MED ORDER — EPHEDRINE SULFATE 50 MG/ML IJ SOLN
INTRAMUSCULAR | Status: AC
  Filled 2014-02-06: qty 1

## 2014-02-06 MED ORDER — FENTANYL CITRATE 0.05 MG/ML IJ SOLN
INTRAMUSCULAR | Status: AC
  Filled 2014-02-06: qty 5

## 2014-02-06 MED ORDER — GLYCOPYRROLATE 0.2 MG/ML IJ SOLN
INTRAMUSCULAR | Status: AC
  Filled 2014-02-06: qty 2

## 2014-02-06 MED ORDER — OXYCODONE-ACETAMINOPHEN 5-325 MG PO TABS
1.0000 | ORAL_TABLET | ORAL | Status: DC | PRN
Start: 2014-02-06 — End: 2014-02-06

## 2014-02-06 MED ORDER — CIPROFLOXACIN IN D5W 400 MG/200ML IV SOLN
INTRAVENOUS | Status: AC
  Administered 2014-02-06: 400 mg via INTRAVENOUS
  Filled 2014-02-06: qty 200

## 2014-02-06 MED ORDER — BUPIVACAINE-EPINEPHRINE 0.25% -1:200000 IJ SOLN
INTRAMUSCULAR | Status: DC | PRN
  Administered 2014-02-06: 14 mL

## 2014-02-06 MED ORDER — ONDANSETRON HCL 4 MG/2ML IJ SOLN
INTRAMUSCULAR | Status: AC
  Filled 2014-02-06: qty 2

## 2014-02-06 MED ORDER — EPHEDRINE SULFATE 50 MG/ML IJ SOLN
INTRAMUSCULAR | Status: DC | PRN
  Administered 2014-02-06 (×2): 5 mg via INTRAVENOUS
  Administered 2014-02-06: 10 mg via INTRAVENOUS

## 2014-02-06 MED ORDER — ROCURONIUM BROMIDE 100 MG/10ML IV SOLN
INTRAVENOUS | Status: DC | PRN
  Administered 2014-02-06: 30 mg via INTRAVENOUS

## 2014-02-06 MED ORDER — GLYCOPYRROLATE 0.2 MG/ML IJ SOLN
INTRAMUSCULAR | Status: DC | PRN
  Administered 2014-02-06: 0.4 mg via INTRAVENOUS

## 2014-02-06 MED ORDER — PHENYLEPHRINE HCL 10 MG/ML IJ SOLN
INTRAMUSCULAR | Status: DC | PRN
  Administered 2014-02-06 (×2): 40 ug via INTRAVENOUS

## 2014-02-06 MED ORDER — MORPHINE SULFATE 2 MG/ML IJ SOLN: 1.0000 mg | INTRAMUSCULAR | Status: DC | PRN

## 2014-02-06 MED ORDER — LACTATED RINGERS IV SOLN
INTRAVENOUS | Status: DC
  Administered 2014-02-06: 50 mL/h via INTRAVENOUS

## 2014-02-06 MED ORDER — ONDANSETRON HCL 4 MG/2ML IJ SOLN
INTRAMUSCULAR | Status: DC | PRN
  Administered 2014-02-06: 4 mg via INTRAVENOUS

## 2014-02-06 MED ORDER — OXYCODONE-ACETAMINOPHEN 5-325 MG PO TABS
1.0000 | ORAL_TABLET | ORAL | Status: DC | PRN
  Administered 2014-02-06: 1 via ORAL
  Filled 2014-02-06: qty 1

## 2014-02-06 MED ORDER — PROPOFOL 10 MG/ML IV BOLUS
INTRAVENOUS | Status: DC | PRN
  Administered 2014-02-06: 100 mg via INTRAVENOUS

## 2014-02-06 MED ORDER — FENTANYL CITRATE 0.05 MG/ML IJ SOLN
INTRAMUSCULAR | Status: AC
  Administered 2014-02-06: 25 ug via INTRAVENOUS
  Filled 2014-02-06: qty 2

## 2014-02-06 MED ORDER — FENTANYL CITRATE 0.05 MG/ML IJ SOLN
INTRAMUSCULAR | Status: DC | PRN
  Administered 2014-02-06: 75 ug via INTRAVENOUS
  Administered 2014-02-06: 50 ug via INTRAVENOUS
  Administered 2014-02-06: 25 ug via INTRAVENOUS

## 2014-02-06 SURGICAL SUPPLY — 45 items
APPLIER CLIP ROT 10 11.4 M/L (STAPLE) ×3
BENZOIN TINCTURE PRP APPL 2/3 (GAUZE/BANDAGES/DRESSINGS) ×3 IMPLANT
BLADE SURG ROTATE 9660 (MISCELLANEOUS) IMPLANT
CANISTER SUCTION 2500CC (MISCELLANEOUS) ×3 IMPLANT
CHLORAPREP W/TINT 26ML (MISCELLANEOUS) ×3 IMPLANT
CLIP APPLIE ROT 10 11.4 M/L (STAPLE) ×1 IMPLANT
COVER MAYO STAND STRL (DRAPES) ×3 IMPLANT
COVER SURGICAL LIGHT HANDLE (MISCELLANEOUS) ×3 IMPLANT
DRAPE C-ARM 42X72 X-RAY (DRAPES) ×3 IMPLANT
DRAPE UTILITY 15X26 W/TAPE STR (DRAPE) ×6 IMPLANT
DRSG TEGADERM 2-3/8X2-3/4 SM (GAUZE/BANDAGES/DRESSINGS) ×9 IMPLANT
DRSG TEGADERM 4X4.75 (GAUZE/BANDAGES/DRESSINGS) ×3 IMPLANT
ELECT REM PT RETURN 9FT ADLT (ELECTROSURGICAL) ×3
ELECTRODE REM PT RTRN 9FT ADLT (ELECTROSURGICAL) ×1 IMPLANT
FILTER SMOKE EVAC LAPAROSHD (FILTER) ×3 IMPLANT
GAUZE SPONGE 2X2 8PLY STRL LF (GAUZE/BANDAGES/DRESSINGS) ×1 IMPLANT
GLOVE BIO SURGEON STRL SZ7 (GLOVE) ×3 IMPLANT
GLOVE BIO SURGEON STRL SZ7.5 (GLOVE) ×3 IMPLANT
GLOVE BIOGEL PI IND STRL 7.0 (GLOVE) ×1 IMPLANT
GLOVE BIOGEL PI IND STRL 7.5 (GLOVE) ×2 IMPLANT
GLOVE BIOGEL PI INDICATOR 7.0 (GLOVE) ×2
GLOVE BIOGEL PI INDICATOR 7.5 (GLOVE) ×4
GLOVE SS BIOGEL STRL SZ 6.5 (GLOVE) ×1 IMPLANT
GLOVE SUPERSENSE BIOGEL SZ 6.5 (GLOVE) ×2
GLOVE SURG SS PI 6.5 STRL IVOR (GLOVE) ×3 IMPLANT
GOWN STRL REUS W/ TWL LRG LVL3 (GOWN DISPOSABLE) ×3 IMPLANT
GOWN STRL REUS W/TWL LRG LVL3 (GOWN DISPOSABLE) ×6
KIT BASIN OR (CUSTOM PROCEDURE TRAY) ×3 IMPLANT
KIT ROOM TURNOVER OR (KITS) ×3 IMPLANT
NS IRRIG 1000ML POUR BTL (IV SOLUTION) ×3 IMPLANT
PAD ARMBOARD 7.5X6 YLW CONV (MISCELLANEOUS) ×3 IMPLANT
POUCH SPECIMEN RETRIEVAL 10MM (ENDOMECHANICALS) ×3 IMPLANT
SCISSORS LAP 5X35 DISP (ENDOMECHANICALS) ×3 IMPLANT
SET CHOLANGIOGRAPH 5 50 .035 (SET/KITS/TRAYS/PACK) ×3 IMPLANT
SET IRRIG TUBING LAPAROSCOPIC (IRRIGATION / IRRIGATOR) ×3 IMPLANT
SLEEVE ENDOPATH XCEL 5M (ENDOMECHANICALS) ×3 IMPLANT
SPECIMEN JAR SMALL (MISCELLANEOUS) ×3 IMPLANT
SPONGE GAUZE 2X2 STER 10/PKG (GAUZE/BANDAGES/DRESSINGS) ×2
SUT MNCRL AB 4-0 PS2 18 (SUTURE) ×3 IMPLANT
TOWEL OR 17X24 6PK STRL BLUE (TOWEL DISPOSABLE) ×3 IMPLANT
TOWEL OR 17X26 10 PK STRL BLUE (TOWEL DISPOSABLE) ×3 IMPLANT
TRAY LAPAROSCOPIC (CUSTOM PROCEDURE TRAY) ×3 IMPLANT
TROCAR XCEL BLUNT TIP 100MML (ENDOMECHANICALS) ×3 IMPLANT
TROCAR XCEL NON-BLD 11X100MML (ENDOMECHANICALS) ×3 IMPLANT
TROCAR XCEL NON-BLD 5MMX100MML (ENDOMECHANICALS) ×3 IMPLANT

## 2014-02-06 NOTE — Discharge Instructions (Signed)
[facility] °LAPAROSCOPIC SURGERY: POST OP INSTRUCTIONS °Always review your discharge instruction sheet given to you by the facility where your surgery was performed. °IF YOU HAVE DISABILITY OR FAMILY LEAVE FORMS, YOU MUST BRING THEM TO THE OFFICE FOR PROCESSING.   °DO NOT GIVE THEM TO YOUR DOCTOR. ° °1. A prescription for pain medication will be given to you upon discharge.  Take your pain medication as prescribed, if needed.  If narcotic pain medicine is not needed, then you may take acetaminophen (Tylenol) or ibuprofen (Advil) as needed. °2. Take your usually prescribed medications unless otherwise directed. °3. If you need a refill on your pain medication, please contact your pharmacy.  They will contact our office to request authorization. Prescriptions will not be filled after 5pm or on week-ends. °4. You should follow a light diet the first few days after arrival home, such as soup and crackers, etc.  Be sure to include lots of fluids daily. °5. Most patients will experience some swelling and bruising in the area of the incisions.  Ice packs will help.  Swelling and bruising can take several days to resolve.  °6. It is common to experience some constipation if taking pain medication after surgery.  Increasing fluid intake and taking a stool softener (such as Colace) will usually help or prevent this problem from occurring.  A mild laxative (Milk of Magnesia or Miralax) should be taken according to package instructions if there are no bowel movements after 48 hours. °7. Unless discharge instructions indicate otherwise, you may remove your bandages 48 hours after surgery, and you may shower at that time.  You will have steri-strips (small skin tapes) in place directly over the incision.  These strips should be left on the skin for 7-10 days.  If your surgeon used skin glue on the incision, you may shower in 24 hours.  The glue will flake off over the next 2-3 weeks.  Any sutures or staples  will be removed at the office during your follow-up visit. °8. ACTIVITIES:  You may resume regular (light) daily activities beginning the next day--such as daily self-care, walking, climbing stairs--gradually increasing activities as tolerated.  You may have sexual intercourse when it is comfortable.  Refrain from any heavy lifting or straining until approved by your doctor. °a. You may drive when you are no longer taking prescription pain medication, you can comfortably wear a seatbelt, and you can safely maneuver your car and apply brakes. °b. RETURN TO WORK:   2-3 weeks °9. You should see your doctor in the office for a follow-up appointment approximately 2-3 weeks after your surgery.  Make sure that you call for this appointment within a day or two after you arrive home to insure a convenient appointment time. °10. OTHER INSTRUCTIONS: ________________________________________________________________________ °WHEN TO CALL YOUR DOCTOR: °1. Fever over 101.0 °2. Inability to urinate °3. Continued bleeding from incision. °4. Increased pain, redness, or drainage from the incision. °5. Increasing abdominal pain ° °The clinic staff is available to answer your questions during regular business hours.  Please don’t hesitate to call and ask to speak to one of the nurses for clinical concerns.  If you have a medical emergency, go to the nearest emergency room or call 911.  A surgeon from [facility] is always on call at the hospital. °[address], [address], [place], [place]  [address] ? [address] [address], [place],    [address] °[phone] ? [phone] ? FAX [phone] °Web site:   [email or web address] ° °

## 2014-02-06 NOTE — Transfer of Care (Signed)
Immediate Anesthesia Transfer of Care Note  Patient: Beth Johnson  Procedure(s) Performed: Procedure(s): LAPAROSCOPIC CHOLECYSTECTOMY WITH INTRAOPERATIVE CHOLANGIOGRAM (N/A)  Patient Location: PACU  Anesthesia Type:General  Level of Consciousness: awake and alert   Airway & Oxygen Therapy: Patient Spontanous Breathing and Patient connected to nasal cannula oxygen  Post-op Assessment: Report given to PACU RN, Post -op Vital signs reviewed and stable and Patient moving all extremities X 4  Post vital signs: Reviewed and stable  Complications: No apparent anesthesia complications

## 2014-02-06 NOTE — Anesthesia Preprocedure Evaluation (Addendum)
Anesthesia Evaluation  Patient identified by MRN, date of birth, ID band Patient awake    Reviewed: Allergy & Precautions, H&P , NPO status , Patient's Chart, lab work & pertinent test results  History of Anesthesia Complications Negative for: history of anesthetic complications  Airway Mallampati: II TM Distance: <3 FB Neck ROM: Full    Dental  (+) Dental Advisory Given, Teeth Intact   Pulmonary neg pulmonary ROS,  breath sounds clear to auscultation        Cardiovascular hypertension, + dysrhythmias Atrial Fibrillation Rhythm:Irregular Rate:Normal  Conrolled aflutter    Neuro/Psych PSYCHIATRIC DISORDERS Depression CVA, No Residual Symptoms    GI/Hepatic GERD-  Medicated,  Endo/Other  Hypothyroidism   Renal/GU      Musculoskeletal   Abdominal   Peds  Hematology  (+) anemia ,   Anesthesia Other Findings   Reproductive/Obstetrics                        Anesthesia Physical Anesthesia Plan  ASA: III  Anesthesia Plan: General   Post-op Pain Management:    Induction: Intravenous  Airway Management Planned: Oral ETT  Additional Equipment:   Intra-op Plan:   Post-operative Plan: Extubation in OR  Informed Consent: I have reviewed the patients History and Physical, chart, labs and discussed the procedure including the risks, benefits and alternatives for the proposed anesthesia with the patient or authorized representative who has indicated his/her understanding and acceptance.   Dental advisory given  Plan Discussed with: CRNA, Anesthesiologist and Surgeon  Anesthesia Plan Comments:         Anesthesia Quick Evaluation

## 2014-02-06 NOTE — Anesthesia Postprocedure Evaluation (Signed)
  Anesthesia Post-op Note  Patient: Beth Johnson  Procedure(s) Performed: Procedure(s): LAPAROSCOPIC CHOLECYSTECTOMY WITH INTRAOPERATIVE CHOLANGIOGRAM (N/A)  Patient Location: PACU  Anesthesia Type:General  Level of Consciousness: awake  Airway and Oxygen Therapy: Patient Spontanous Breathing  Post-op Pain: mild  Post-op Assessment: Post-op Vital signs reviewed  Post-op Vital Signs: Reviewed  Complications: No apparent anesthesia complications

## 2014-02-06 NOTE — Op Note (Signed)
Laparoscopic Cholecystectomy with IOC Procedure Note  Indications: This patient presents with symptomatic gallbladder disease and will undergo laparoscopic cholecystectomy.  Pre-operative Diagnosis: Calculus of gallbladder with other cholecystitis, without mention of obstruction  Post-operative Diagnosis: Same  Surgeon: Marlene Pfluger K.   Assistants: Sharyn Dross, RNFA  Anesthesia: General endotracheal anesthesia  ASA Class: 2  Procedure Details  The patient was seen again in the Holding Room. The risks, benefits, complications, treatment options, and expected outcomes were discussed with the patient. The possibilities of reaction to medication, pulmonary aspiration, perforation of viscus, bleeding, recurrent infection, finding a normal gallbladder, the need for additional procedures, failure to diagnose a condition, the possible need to convert to an open procedure, and creating a complication requiring transfusion or operation were discussed with the patient. The likelihood of improving the patient's symptoms with return to their baseline status is good.  The patient and/or family concurred with the proposed plan, giving informed consent. The site of surgery properly noted. The patient was taken to Operating Room, identified as Beth Johnson and the procedure verified as Laparoscopic Cholecystectomy with Intraoperative Cholangiogram. A Time Out was held and the above information confirmed.  Prior to the induction of general anesthesia, antibiotic prophylaxis was administered. General endotracheal anesthesia was then administered and tolerated well. After the induction, the abdomen was prepped with Chloraprep and draped in the sterile fashion. The patient was positioned in the supine position.  Local anesthetic agent was injected into the skin near the umbilicus and an incision made. We dissected down to the abdominal fascia with blunt dissection.  The fascia was incised vertically and we  entered the peritoneal cavity bluntly.  A pursestring suture of 0-Vicryl was placed around the fascial opening.  There were significant omental adhesions to the anterior abdominal wall.  I used a 5 mm Optiview port in the epigastrium to cannulate the peritoneal cavity under direct vision.  Pneumoperitoneum was then created with CO2 and tolerated well without any adverse changes in the patient's vital signs.  The Hasson cannula was inserted through the umbilical port site around some of the omental adhesions and secured with the stay suture.  The subxiphoid port was upsized to an 11-mm port.  Two 5-mm ports were placed in the right upper quadrant. All skin incisions were infiltrated with a local anesthetic agent before making the incision and placing the trocars.  The omental adhesions were cleared from around the umbilicus with scissors.  We positioned the patient in reverse Trendelenburg, tilted slightly to the patient's left.  The gallbladder was identified, the fundus grasped and retracted cephalad. Adhesions were lysed bluntly and with the electrocautery where indicated, taking care not to injure any adjacent organs or viscus. The infundibulum was grasped and retracted laterally, exposing the peritoneum overlying the triangle of Calot. This was then divided and exposed in a blunt fashion. A critical view of the cystic duct and cystic artery was obtained.  The cystic duct was clearly identified and bluntly dissected circumferentially. The cystic duct was ligated with a clip distally.   An incision was made in the cystic duct and the [facility] cholangiogram catheter introduced. The catheter was secured using a clip. A cholangiogram was then obtained which showed good visualization of the distal and proximal biliary tree with no sign of filling defects or obstruction.  Contrast flowed easily into the duodenum. The catheter was then removed.   The cystic duct was then ligated with clips and divided. The cystic artery  was identified, dissected free, ligated with  clips and divided as well.   The gallbladder was dissected from the liver bed in retrograde fashion with the electrocautery. The gallbladder was removed and placed in an Endocatch sac. The liver bed was irrigated and inspected. Hemostasis was achieved with the electrocautery. Copious irrigation was utilized and was repeatedly aspirated until clear.  The gallbladder and Endocatch sac were then removed through the umbilical port site.  The pursestring suture was used to close the umbilical fascia.    We again inspected the right upper quadrant for hemostasis.  Pneumoperitoneum was released as we removed the trocars.  4-0 Monocryl was used to close the skin.   Benzoin, steri-strips, and clean dressings were applied. The patient was then extubated and brought to the recovery room in stable condition. Instrument, sponge, and needle counts were correct at closure and at the conclusion of the case.   Findings: Cholecystitis with Cholelithiasis  Estimated Blood Loss: less than 50 mL         Drains: none         Specimens: Gallbladder           Complications: None; patient tolerated the procedure well.         Disposition: PACU - hemodynamically stable.         Condition: stable  Imogene Burn. Georgette Dover, MD, [facility]  General/ Trauma Surgery  02/06/2014 12:21 PM

## 2014-02-06 NOTE — Progress Notes (Signed)
REPORT CALLED TO ADRIAN,RN FOR 5-N-14

## 2014-02-06 NOTE — Progress Notes (Signed)
Dr. Georgette Dover at bedside.  Aware of HR and chronic a. Fib. Orders to d/c home

## 2014-02-06 NOTE — Progress Notes (Signed)
IN AND OUT CATH OF 500CC CLEAR YELLOW URINE WITH # 16FR CATH AND TOL WELL; DR Redmond Pulling NOTIFIED AND ORDERS NOTED

## 2014-02-06 NOTE — Interval H&P Note (Signed)
History and Physical Interval Note:  02/06/2014 10:06 AM  Beth Johnson  has presented today for surgery, with the diagnosis of chronic calalus cholecystitis  The various methods of treatment have been discussed with the patient and family. After consideration of risks, benefits and other options for treatment, the patient has consented to  Procedure(s): LAPAROSCOPIC CHOLECYSTECTOMY WITH INTRAOPERATIVE CHOLANGIOGRAM (N/A) as a surgical intervention .  The patient's history has been reviewed, patient examined, no change in status, stable for surgery.  I have reviewed the patient's chart and labs.  Questions were answered to the patient's satisfaction.     Ingeborg Fite K.

## 2014-02-06 NOTE — H&P (View-Only) (Signed)
Patient ID: Beth Johnson, female   DOB: [birth year], 73 y.o.   MRN: [id number]  Chief Complaint  Patient presents with  . eval gallbladder    HPI Beth Johnson is a 73 y.o. female.  PCP - Dr. Esmeralda Arthur - [place], [place] GI - Dr. 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 She has chronic atrial fibrillation and is on Xarelto.  She has almost recovered completely from her stroke. Over the last several months she has developed increasing epigastric and right upper quadrant abdominal pain. This tends to be worse in the evening. She denies any nausea or vomiting. She does have occasional diarrhea. Sometimes her symptoms get worse when she has a salad with dressing. She underwent workup by GI at [facility] in [place], [place]. The GI physician performed an upper endoscopy which was significant only for some mild nonerosive gastritis. She then underwent an ultrasound performed on 01/27/14 that showed 2 gallstones within the gallbladder but no subtle wall thickening. The common bile duct was normal. Her liver function tests showed a total bilirubin of 0.6 alkaline phosphatase elevated at 201 AST 38 ALT slightly elevated at 50 and lipase 44. Her daughter lives here in [place] so she is now referred to [place] for surgery.  She has seen Dr. Candee Furbish of cardiology here in [place] in the past. She is rate controlled with diltiazem for her chronic atrial fibrillation.  Past Medical History  Diagnosis Date  . CVA (cerebral infarction)     new onset A. fib with CVA 09/2013: LUE weakness, left visual field deficit, facial draw, altered cognition  . Atrial fibrillation   . Depression   . Hypothyroidism     Past Surgical History  Procedure Laterality Date  . Abdominal hysterectomy    . Bil hip replacement (  2 on ? left)    . Appendectomy      Family History  Problem Relation Age of Onset  . Heart attack Father   . CAD Father   . Gallstones Mother   . Cancer Mother     Social History History  Substance Use Topics  . Smoking status: Never Smoker   . Smokeless tobacco: Not on file  . Alcohol Use: Yes    Allergies  Allergen Reactions  . Penicillins   . Sulfa Antibiotics     Current Outpatient Prescriptions  Medication Sig Dispense Refill  . Calcium Citrate (CITRACAL PO) Take by mouth.      . Cholecalciferol (VITAMIN D PO) Take 1,000 mg by mouth daily.      Marland Kitchen diltiazem (CARDIZEM CD) 120 MG 24 hr capsule Take 1 capsule (120 mg total) by mouth daily.  90 capsule  4  . donepezil (ARICEPT) 10 MG tablet Take 1 tablet (10 mg total) by mouth at bedtime.  90 tablet  1  . escitalopram (LEXAPRO) 10 MG tablet Take 10 mg by mouth daily.      Marland Kitchen levothyroxine (SYNTHROID, LEVOTHROID) 50 MCG tablet Take 50 mcg by mouth daily before breakfast.      . Methylfol-Methylcob-Acetylcyst (CEREFOLIN NAC) 6-2-600 MG TABS Take 1 tablet by mouth daily.  90 each  1  . Omega-3 Fatty Acids (FISH OIL) 500 MG CAPS Take 500 mg by mouth daily.      Marland Kitchen omeprazole (PRILOSEC) 40 MG capsule       .  Rivaroxaban (XARELTO) 15 MG TABS tablet Take 15 mg by mouth daily with supper.       No current facility-administered medications for this visit.    Review of Systems Review of Systems  Constitutional: Negative for fever, chills and unexpected weight change.  HENT: Negative for congestion, hearing loss, sore throat, trouble swallowing and voice change.   Eyes: Negative for visual disturbance.  Respiratory: Negative for cough and wheezing.   Cardiovascular: Negative for chest pain, palpitations and leg swelling.  Gastrointestinal: Positive for abdominal pain, diarrhea and abdominal distention. Negative for nausea, vomiting, constipation, blood in stool and anal bleeding.  Genitourinary: Negative for hematuria, vaginal  bleeding and difficulty urinating.  Musculoskeletal: Negative for arthralgias.  Skin: Negative for rash and wound.  Neurological: Negative for seizures, syncope and headaches.  Hematological: Negative for adenopathy. Does not bruise/bleed easily.  Psychiatric/Behavioral: Negative for confusion.    Blood pressure 118/70, pulse 72, temperature 98.4 F (36.9 C), temperature source Oral, height 5\' 2"  (1.575 m), weight 124 lb 9.6 oz (56.518 kg).  Physical Exam Physical Exam WDWN in NAD HEENT:  EOMI, sclera anicteric Neck:  No masses, no thyromegaly Lungs:  CTA bilaterally; normal respiratory effort CV:  Regular rate and rhythm; no murmurs Abd:  +bowel sounds, soft, mildly tender in epigastrium; no palpable masses Ext:  Well-perfused; no edema Skin:  Warm, dry; no sign of jaundice  Data Reviewed Labs, Ultrasound, EGD report from [facility]  Assessment    Chronic calculus cholecystitis Chronic atrial fibrillation - rate-controlled on Diltiazem     Plan    Laparoscopic cholecystectomy with intraoperative cholangiogram.  The surgical procedure has been discussed with the patient.  Potential risks, benefits, alternative treatments, and expected outcomes have been explained.  All of the patient's questions at this time have been answered.  The likelihood of reaching the patient's treatment goal is good.  The patient understand the proposed surgical procedure and wishes to proceed. Hold Xarelto.  I have contacted Dr. Marlou Porch to make sure that he has no concerns about her surgery.        Aubryana Vittorio K. 02/02/2014, 12:43 PM

## 2014-02-06 NOTE — Anesthesia Procedure Notes (Addendum)
Procedure Name: Intubation Date/Time: 02/06/2014 11:13 AM Performed by: Blair Heys E Pre-anesthesia Checklist: Patient identified, Emergency Drugs available, Suction available and Patient being monitored Patient Re-evaluated:Patient Re-evaluated prior to inductionOxygen Delivery Method: Circle system utilized Preoxygenation: Pre-oxygenation with 100% oxygen Intubation Type: IV induction Ventilation: Mask ventilation without difficulty Laryngoscope Size: Miller and 2 Grade View: Grade I Tube type: Oral Tube size: 7.5 mm Number of attempts: 1 Airway Equipment and Method: Stylet Placement Confirmation: ETT inserted through vocal cords under direct vision,  positive ETCO2,  CO2 detector and breath sounds checked- equal and bilateral Secured at: 22 cm Tube secured with: Tape Dental Injury: Teeth and Oropharynx as per pre-operative assessment

## 2014-02-07 MED ORDER — OXYCODONE-ACETAMINOPHEN 5-325 MG PO TABS: 1.0000 | ORAL_TABLET | ORAL | Status: DC | PRN

## 2014-02-07 NOTE — Discharge Summary (Signed)
Physician Discharge Summary  Patient ID: Beth Johnson MRN: [id number] DOB/AGE: 11/11/1940 73 y.o.  Admit date: 02/06/2014 Discharge date: 02/07/2014  Admitting Diagnosis: Chronic calculus cholecystitis  Post operative urinary retention  Discharge Diagnosis Patient Active Problem List   Diagnosis Date Noted  . Status post cholecystectomy 02/06/2014  . Chronic cholecystitis with calculus 02/02/2014  . Alzheimer's disease 10/20/2013  . Atrial fibrillation 10/11/2013  . History of stroke 10/11/2013  . Chronic anticoagulation 10/11/2013  . CVA (cerebral infarction) 10/07/2013  . Hypertension 10/07/2013  . Unspecified hypothyroidism 10/07/2013  . Hypercholesterolemia 10/07/2013  . CKD (chronic kidney disease) 10/07/2013  . Atrial flutter 10/07/2013    Consultants none  Imaging: Dg Cholangiogram Operative  02/06/2014   CLINICAL DATA:  Chronic calculus cholecystitis  EXAM: INTRAOPERATIVE CHOLANGIOGRAM  TECHNIQUE: Cholangiographic images from the C-arm fluoroscopic device were submitted for interpretation post-operatively. Please see the procedural report for the amount of contrast and the fluoroscopy time utilized.  COMPARISON:  None.  FINDINGS: No persistent filling defects in the common duct. Intrahepatic ducts are incompletely visualized, appearing decompressed centrally. Contrast passes into the duodenum.  : Negative for retained common duct stone.   Electronically Signed   By: Arne Cleveland M.D.   On: 02/06/2014 15:02    Procedures Laparoscopic cholecystectomy with IOC---Dr. Georgette Dover 02/06/14  Hospital Course:  Beth Johnson presented for an elective cholecystectomy on 02/06/14.  Post operatively, she was unable to void, had an In and out cath and was therefore kept overnight.  She was able to void several times thereafter.  She was transferred to the floor.  Her vital signs remained stable.  On POd#1 the patient was tolerating a diet, passing flatus, ambulating, pain well  controlled and urinating.  She was therefore felt stable for discharge.  She was advised to follow up with Dr. Georgette Dover in 2-3 weeks for a post op check.  She was provided for an Rx for percocet which she can take PRN.  We reviewed other non narcotic pain treatment, activity restriction and medication side effects.  She was encouraged to call with questions or concerns.      Physical Exam: General:  Alert, NAD, pleasant, comfortable Abd:  Soft, ND, mild tenderness, incisions C/D/I    Medication List         CARDIZEM CD 120 MG 24 hr capsule  Generic drug:  diltiazem  Take 120 mg by mouth at bedtime.     CEREFOLIN NAC 6-2-600 MG Tabs  Take 1 tablet by mouth daily.     clobetasol 0.05 % external solution  Commonly known as:  TEMOVATE  Apply 1 application topically 2 (two) times daily as needed (dry scalp).     donepezil 10 MG tablet  Commonly known as:  ARICEPT  Take 10 mg by mouth every morning.     escitalopram 10 MG tablet  Commonly known as:  LEXAPRO  Take 10 mg by mouth daily.     HYDROcodone-acetaminophen 5-325 MG per tablet  Commonly known as:  NORCO/VICODIN  Take 1 tablet by mouth every 4 (four) hours as needed.     ipratropium 0.06 % nasal spray  Commonly known as:  ATROVENT  Place 2 sprays into both nostrils 2 (two) times daily as needed (post nasal drip).     levothyroxine 50 MCG tablet  Commonly known as:  SYNTHROID, LEVOTHROID  Take 50 mcg by mouth at bedtime.     metroNIDAZOLE 0.75 % gel  Commonly known as:  METROGEL  Apply 1 application topically  2 (two) times daily as needed (rosacia).     omeprazole 40 MG capsule  Commonly known as:  PRILOSEC  Take 40 mg by mouth daily.     ondansetron 4 MG disintegrating tablet  Commonly known as:  ZOFRAN ODT  Take 1 tablet (4 mg total) by mouth every 8 (eight) hours as needed for nausea or vomiting.     oxyCODONE-acetaminophen 5-325 MG per tablet  Commonly known as:  PERCOCET/ROXICET  Take 1 tablet by mouth every 4  (four) hours as needed for moderate pain.     Rivaroxaban 15 MG Tabs tablet  Commonly known as:  XARELTO  Take 15 mg by mouth daily with supper.     VITAMIN D PO  Take 1,000 mg by mouth daily.             Follow-up Information   Follow up with TSUEI,MATTHEW K., MD In 3 weeks.   Specialty:  General Surgery   Contact information:   [address] [place] [address] [phone]       Signed: Erby Pian, [facility] Surgery [phone]  02/07/2014, 8:29 AM

## 2014-02-08 ENCOUNTER — Encounter (HOSPITAL_COMMUNITY): Payer: Self-pay | Admitting: Surgery

## 2014-02-09 NOTE — Discharge Summary (Signed)
Beth Johnson. Georgette Dover, MD, [facility] Surgery  General/ Trauma Surgery  02/09/2014 11:56 AM

## 2014-02-15 ENCOUNTER — Encounter (INDEPENDENT_AMBULATORY_CARE_PROVIDER_SITE_OTHER): Payer: Self-pay

## 2014-02-17 ENCOUNTER — Encounter (INDEPENDENT_AMBULATORY_CARE_PROVIDER_SITE_OTHER): Payer: Self-pay | Admitting: Surgery

## 2014-02-17 DIAGNOSIS — R197 Diarrhea, unspecified: Secondary | ICD-10-CM | POA: Diagnosis not present

## 2014-02-17 DIAGNOSIS — R609 Edema, unspecified: Secondary | ICD-10-CM | POA: Diagnosis not present

## 2014-02-27 ENCOUNTER — Encounter (INDEPENDENT_AMBULATORY_CARE_PROVIDER_SITE_OTHER): Payer: Medicare Other | Admitting: Surgery

## 2014-03-08 DIAGNOSIS — E7800 Pure hypercholesterolemia, unspecified: Secondary | ICD-10-CM | POA: Diagnosis not present

## 2014-03-08 DIAGNOSIS — I4892 Unspecified atrial flutter: Secondary | ICD-10-CM | POA: Diagnosis not present

## 2014-03-08 DIAGNOSIS — I10 Essential (primary) hypertension: Secondary | ICD-10-CM | POA: Diagnosis not present

## 2014-03-10 DIAGNOSIS — R413 Other amnesia: Secondary | ICD-10-CM | POA: Diagnosis not present

## 2014-03-10 DIAGNOSIS — G3184 Mild cognitive impairment, so stated: Secondary | ICD-10-CM | POA: Diagnosis not present

## 2014-03-10 DIAGNOSIS — I6340 Cerebral infarction due to embolism of unspecified cerebral artery: Secondary | ICD-10-CM | POA: Diagnosis not present

## 2014-03-10 DIAGNOSIS — M545 Low back pain, unspecified: Secondary | ICD-10-CM | POA: Diagnosis not present

## 2014-03-10 DIAGNOSIS — I4892 Unspecified atrial flutter: Secondary | ICD-10-CM | POA: Diagnosis not present

## 2014-04-05 DIAGNOSIS — F0390 Unspecified dementia without behavioral disturbance: Secondary | ICD-10-CM | POA: Diagnosis not present

## 2014-04-05 DIAGNOSIS — I4891 Unspecified atrial fibrillation: Secondary | ICD-10-CM | POA: Diagnosis not present

## 2014-04-05 DIAGNOSIS — I6340 Cerebral infarction due to embolism of unspecified cerebral artery: Secondary | ICD-10-CM | POA: Diagnosis not present

## 2014-04-05 DIAGNOSIS — E7800 Pure hypercholesterolemia, unspecified: Secondary | ICD-10-CM | POA: Diagnosis not present

## 2014-04-05 DIAGNOSIS — I10 Essential (primary) hypertension: Secondary | ICD-10-CM | POA: Diagnosis not present

## 2014-04-05 DIAGNOSIS — N183 Chronic kidney disease, stage 3 unspecified: Secondary | ICD-10-CM | POA: Diagnosis not present

## 2014-04-06 ENCOUNTER — Telehealth: Payer: Self-pay | Admitting: Neurology

## 2014-04-06 DIAGNOSIS — F0280 Dementia in other diseases classified elsewhere without behavioral disturbance: Secondary | ICD-10-CM

## 2014-04-06 DIAGNOSIS — G309 Alzheimer's disease, unspecified: Principal | ICD-10-CM

## 2014-04-06 MED ORDER — CEREFOLIN NAC 6-2-600 MG PO TABS: 1.0000 | ORAL_TABLET | Freq: Every day | ORAL | Status: DC

## 2014-04-06 NOTE — Telephone Encounter (Signed)
R has been sent  

## 2014-04-10 DIAGNOSIS — E039 Hypothyroidism, unspecified: Secondary | ICD-10-CM | POA: Diagnosis not present

## 2014-04-10 DIAGNOSIS — I6340 Cerebral infarction due to embolism of unspecified cerebral artery: Secondary | ICD-10-CM | POA: Diagnosis not present

## 2014-04-10 DIAGNOSIS — R197 Diarrhea, unspecified: Secondary | ICD-10-CM | POA: Diagnosis not present

## 2014-04-12 DIAGNOSIS — E039 Hypothyroidism, unspecified: Secondary | ICD-10-CM | POA: Diagnosis not present

## 2014-04-12 DIAGNOSIS — R609 Edema, unspecified: Secondary | ICD-10-CM | POA: Diagnosis not present

## 2014-04-12 DIAGNOSIS — Z880 Allergy status to penicillin: Secondary | ICD-10-CM | POA: Diagnosis not present

## 2014-04-12 DIAGNOSIS — Z8673 Personal history of transient ischemic attack (TIA), and cerebral infarction without residual deficits: Secondary | ICD-10-CM | POA: Diagnosis not present

## 2014-04-12 DIAGNOSIS — Q211 Atrial septal defect: Secondary | ICD-10-CM | POA: Diagnosis not present

## 2014-04-12 DIAGNOSIS — Z7901 Long term (current) use of anticoagulants: Secondary | ICD-10-CM | POA: Diagnosis not present

## 2014-04-12 DIAGNOSIS — Z79899 Other long term (current) drug therapy: Secondary | ICD-10-CM | POA: Diagnosis not present

## 2014-04-12 DIAGNOSIS — K922 Gastrointestinal hemorrhage, unspecified: Secondary | ICD-10-CM | POA: Diagnosis not present

## 2014-04-12 DIAGNOSIS — F0390 Unspecified dementia without behavioral disturbance: Secondary | ICD-10-CM | POA: Diagnosis not present

## 2014-04-12 DIAGNOSIS — I10 Essential (primary) hypertension: Secondary | ICD-10-CM | POA: Diagnosis not present

## 2014-04-12 DIAGNOSIS — Z882 Allergy status to sulfonamides status: Secondary | ICD-10-CM | POA: Diagnosis not present

## 2014-04-12 DIAGNOSIS — M1990 Unspecified osteoarthritis, unspecified site: Secondary | ICD-10-CM | POA: Diagnosis not present

## 2014-04-12 DIAGNOSIS — Q2111 Secundum atrial septal defect: Secondary | ICD-10-CM | POA: Diagnosis not present

## 2014-04-12 DIAGNOSIS — F29 Unspecified psychosis not due to a substance or known physiological condition: Secondary | ICD-10-CM | POA: Diagnosis not present

## 2014-04-14 DIAGNOSIS — I6340 Cerebral infarction due to embolism of unspecified cerebral artery: Secondary | ICD-10-CM | POA: Diagnosis not present

## 2014-04-14 DIAGNOSIS — D500 Iron deficiency anemia secondary to blood loss (chronic): Secondary | ICD-10-CM | POA: Diagnosis not present

## 2014-04-17 DIAGNOSIS — R195 Other fecal abnormalities: Secondary | ICD-10-CM | POA: Diagnosis not present

## 2014-04-17 DIAGNOSIS — K5520 Angiodysplasia of colon without hemorrhage: Secondary | ICD-10-CM | POA: Diagnosis not present

## 2014-04-17 DIAGNOSIS — R1013 Epigastric pain: Secondary | ICD-10-CM | POA: Diagnosis not present

## 2014-04-17 DIAGNOSIS — Z7901 Long term (current) use of anticoagulants: Secondary | ICD-10-CM | POA: Diagnosis not present

## 2014-04-17 DIAGNOSIS — R159 Full incontinence of feces: Secondary | ICD-10-CM | POA: Diagnosis not present

## 2014-04-17 DIAGNOSIS — R197 Diarrhea, unspecified: Secondary | ICD-10-CM | POA: Diagnosis not present

## 2014-04-17 DIAGNOSIS — D509 Iron deficiency anemia, unspecified: Secondary | ICD-10-CM | POA: Diagnosis not present

## 2014-04-24 DIAGNOSIS — E870 Hyperosmolality and hypernatremia: Secondary | ICD-10-CM | POA: Diagnosis not present

## 2014-04-24 DIAGNOSIS — E871 Hypo-osmolality and hyponatremia: Secondary | ICD-10-CM | POA: Diagnosis not present

## 2014-04-24 DIAGNOSIS — I129 Hypertensive chronic kidney disease with stage 1 through stage 4 chronic kidney disease, or unspecified chronic kidney disease: Secondary | ICD-10-CM | POA: Diagnosis not present

## 2014-04-24 DIAGNOSIS — I4891 Unspecified atrial fibrillation: Secondary | ICD-10-CM | POA: Diagnosis not present

## 2014-04-24 DIAGNOSIS — J9819 Other pulmonary collapse: Secondary | ICD-10-CM | POA: Diagnosis not present

## 2014-04-24 DIAGNOSIS — D509 Iron deficiency anemia, unspecified: Secondary | ICD-10-CM | POA: Diagnosis not present

## 2014-04-24 DIAGNOSIS — R17 Unspecified jaundice: Secondary | ICD-10-CM | POA: Diagnosis not present

## 2014-04-24 DIAGNOSIS — D62 Acute posthemorrhagic anemia: Secondary | ICD-10-CM | POA: Diagnosis not present

## 2014-04-24 DIAGNOSIS — E876 Hypokalemia: Secondary | ICD-10-CM | POA: Diagnosis not present

## 2014-04-24 DIAGNOSIS — F0390 Unspecified dementia without behavioral disturbance: Secondary | ICD-10-CM | POA: Diagnosis not present

## 2014-04-25 DIAGNOSIS — K5520 Angiodysplasia of colon without hemorrhage: Secondary | ICD-10-CM | POA: Diagnosis not present

## 2014-04-25 DIAGNOSIS — E876 Hypokalemia: Secondary | ICD-10-CM | POA: Diagnosis present

## 2014-04-25 DIAGNOSIS — E039 Hypothyroidism, unspecified: Secondary | ICD-10-CM | POA: Diagnosis present

## 2014-04-25 DIAGNOSIS — E870 Hyperosmolality and hypernatremia: Secondary | ICD-10-CM | POA: Diagnosis present

## 2014-04-25 DIAGNOSIS — E785 Hyperlipidemia, unspecified: Secondary | ICD-10-CM | POA: Diagnosis present

## 2014-04-25 DIAGNOSIS — D500 Iron deficiency anemia secondary to blood loss (chronic): Secondary | ICD-10-CM | POA: Diagnosis not present

## 2014-04-25 DIAGNOSIS — K449 Diaphragmatic hernia without obstruction or gangrene: Secondary | ICD-10-CM | POA: Diagnosis present

## 2014-04-25 DIAGNOSIS — I129 Hypertensive chronic kidney disease with stage 1 through stage 4 chronic kidney disease, or unspecified chronic kidney disease: Secondary | ICD-10-CM | POA: Diagnosis present

## 2014-04-25 DIAGNOSIS — R609 Edema, unspecified: Secondary | ICD-10-CM | POA: Diagnosis not present

## 2014-04-25 DIAGNOSIS — N183 Chronic kidney disease, stage 3 unspecified: Secondary | ICD-10-CM | POA: Diagnosis present

## 2014-04-25 DIAGNOSIS — Z8673 Personal history of transient ischemic attack (TIA), and cerebral infarction without residual deficits: Secondary | ICD-10-CM | POA: Diagnosis not present

## 2014-04-25 DIAGNOSIS — N269 Renal sclerosis, unspecified: Secondary | ICD-10-CM | POA: Diagnosis present

## 2014-04-25 DIAGNOSIS — J90 Pleural effusion, not elsewhere classified: Secondary | ICD-10-CM | POA: Diagnosis not present

## 2014-04-25 DIAGNOSIS — F0390 Unspecified dementia without behavioral disturbance: Secondary | ICD-10-CM | POA: Diagnosis present

## 2014-04-25 DIAGNOSIS — Z881 Allergy status to other antibiotic agents status: Secondary | ICD-10-CM | POA: Diagnosis not present

## 2014-04-25 DIAGNOSIS — K219 Gastro-esophageal reflux disease without esophagitis: Secondary | ICD-10-CM | POA: Diagnosis present

## 2014-04-25 DIAGNOSIS — D62 Acute posthemorrhagic anemia: Secondary | ICD-10-CM | POA: Diagnosis present

## 2014-04-25 DIAGNOSIS — Z880 Allergy status to penicillin: Secondary | ICD-10-CM | POA: Diagnosis not present

## 2014-04-25 DIAGNOSIS — R109 Unspecified abdominal pain: Secondary | ICD-10-CM | POA: Diagnosis present

## 2014-04-25 DIAGNOSIS — J9819 Other pulmonary collapse: Secondary | ICD-10-CM | POA: Diagnosis present

## 2014-04-25 DIAGNOSIS — E871 Hypo-osmolality and hyponatremia: Secondary | ICD-10-CM | POA: Diagnosis not present

## 2014-04-25 DIAGNOSIS — I4891 Unspecified atrial fibrillation: Secondary | ICD-10-CM | POA: Diagnosis present

## 2014-04-25 DIAGNOSIS — R1013 Epigastric pain: Secondary | ICD-10-CM | POA: Diagnosis not present

## 2014-04-25 DIAGNOSIS — D509 Iron deficiency anemia, unspecified: Secondary | ICD-10-CM | POA: Diagnosis present

## 2014-05-01 DIAGNOSIS — I6340 Cerebral infarction due to embolism of unspecified cerebral artery: Secondary | ICD-10-CM | POA: Diagnosis not present

## 2014-05-01 DIAGNOSIS — D500 Iron deficiency anemia secondary to blood loss (chronic): Secondary | ICD-10-CM | POA: Diagnosis not present

## 2014-05-01 DIAGNOSIS — Z79899 Other long term (current) drug therapy: Secondary | ICD-10-CM | POA: Diagnosis not present

## 2014-05-01 DIAGNOSIS — E871 Hypo-osmolality and hyponatremia: Secondary | ICD-10-CM | POA: Diagnosis not present

## 2014-05-01 DIAGNOSIS — R609 Edema, unspecified: Secondary | ICD-10-CM | POA: Diagnosis not present

## 2014-05-06 ENCOUNTER — Other Ambulatory Visit: Payer: Self-pay | Admitting: Neurology

## 2014-05-25 DIAGNOSIS — Z471 Aftercare following joint replacement surgery: Secondary | ICD-10-CM | POA: Diagnosis not present

## 2014-05-25 DIAGNOSIS — IMO0002 Reserved for concepts with insufficient information to code with codable children: Secondary | ICD-10-CM | POA: Diagnosis not present

## 2014-05-26 DIAGNOSIS — IMO0002 Reserved for concepts with insufficient information to code with codable children: Secondary | ICD-10-CM | POA: Diagnosis not present

## 2014-06-07 DIAGNOSIS — M545 Low back pain, unspecified: Secondary | ICD-10-CM | POA: Diagnosis not present

## 2014-06-07 DIAGNOSIS — IMO0002 Reserved for concepts with insufficient information to code with codable children: Secondary | ICD-10-CM | POA: Diagnosis not present

## 2014-06-16 DIAGNOSIS — I6340 Cerebral infarction due to embolism of unspecified cerebral artery: Secondary | ICD-10-CM | POA: Diagnosis not present

## 2014-06-16 DIAGNOSIS — R609 Edema, unspecified: Secondary | ICD-10-CM | POA: Diagnosis not present

## 2014-06-16 DIAGNOSIS — Z1231 Encounter for screening mammogram for malignant neoplasm of breast: Secondary | ICD-10-CM | POA: Diagnosis not present

## 2014-06-16 DIAGNOSIS — E039 Hypothyroidism, unspecified: Secondary | ICD-10-CM | POA: Diagnosis not present

## 2014-06-16 DIAGNOSIS — F329 Major depressive disorder, single episode, unspecified: Secondary | ICD-10-CM | POA: Diagnosis not present

## 2014-06-16 DIAGNOSIS — F3289 Other specified depressive episodes: Secondary | ICD-10-CM | POA: Diagnosis not present

## 2014-06-26 DIAGNOSIS — D1801 Hemangioma of skin and subcutaneous tissue: Secondary | ICD-10-CM | POA: Diagnosis not present

## 2014-06-26 DIAGNOSIS — L819 Disorder of pigmentation, unspecified: Secondary | ICD-10-CM | POA: Diagnosis not present

## 2014-06-26 DIAGNOSIS — L981 Factitial dermatitis: Secondary | ICD-10-CM | POA: Diagnosis not present

## 2014-06-26 DIAGNOSIS — D2370 Other benign neoplasm of skin of unspecified lower limb, including hip: Secondary | ICD-10-CM | POA: Diagnosis not present

## 2014-06-27 ENCOUNTER — Other Ambulatory Visit (HOSPITAL_COMMUNITY): Payer: Self-pay | Admitting: Orthopedic Surgery

## 2014-06-27 ENCOUNTER — Other Ambulatory Visit (HOSPITAL_COMMUNITY): Payer: Self-pay | Admitting: *Deleted

## 2014-06-27 ENCOUNTER — Other Ambulatory Visit (HOSPITAL_COMMUNITY): Payer: Self-pay | Admitting: Neurology

## 2014-06-27 DIAGNOSIS — M25551 Pain in right hip: Secondary | ICD-10-CM

## 2014-06-27 DIAGNOSIS — I6340 Cerebral infarction due to embolism of unspecified cerebral artery: Secondary | ICD-10-CM

## 2014-06-29 ENCOUNTER — Ambulatory Visit (HOSPITAL_COMMUNITY)
Admission: RE | Admit: 2014-06-29 | Discharge: 2014-06-29 | Disposition: A | Discharge status: Home / Self Care | Payer: Medicare Other | Source: Ambulatory Visit | Attending: *Deleted | Admitting: *Deleted

## 2014-06-29 ENCOUNTER — Ambulatory Visit (HOSPITAL_COMMUNITY)
Admission: RE | Admit: 2014-06-29 | Discharge: 2014-06-29 | Disposition: A | Discharge status: Home / Self Care | Payer: Medicare Other | Source: Ambulatory Visit | Attending: Emergency Medicine | Admitting: Emergency Medicine

## 2014-06-29 ENCOUNTER — Encounter (HOSPITAL_COMMUNITY): Payer: Medicare Other | Admitting: Certified Registered"

## 2014-06-29 ENCOUNTER — Encounter (HOSPITAL_COMMUNITY)
Admission: RE | Disposition: A | Discharge status: Home / Self Care | Payer: Self-pay | Source: Ambulatory Visit | Attending: Emergency Medicine

## 2014-06-29 ENCOUNTER — Encounter (HOSPITAL_COMMUNITY): Payer: Self-pay | Admitting: *Deleted

## 2014-06-29 ENCOUNTER — Ambulatory Visit (HOSPITAL_COMMUNITY): Payer: Medicare Other | Admitting: Certified Registered"

## 2014-06-29 ENCOUNTER — Encounter (HOSPITAL_COMMUNITY): Payer: Self-pay | Admitting: Pharmacy Technician

## 2014-06-29 DIAGNOSIS — I4891 Unspecified atrial fibrillation: Secondary | ICD-10-CM | POA: Diagnosis not present

## 2014-06-29 DIAGNOSIS — I6340 Cerebral infarction due to embolism of unspecified cerebral artery: Secondary | ICD-10-CM

## 2014-06-29 DIAGNOSIS — I6350 Cerebral infarction due to unspecified occlusion or stenosis of unspecified cerebral artery: Secondary | ICD-10-CM | POA: Diagnosis not present

## 2014-06-29 DIAGNOSIS — M25551 Pain in right hip: Secondary | ICD-10-CM

## 2014-06-29 DIAGNOSIS — Z4789 Encounter for other orthopedic aftercare: Secondary | ICD-10-CM | POA: Diagnosis not present

## 2014-06-29 DIAGNOSIS — M25559 Pain in unspecified hip: Secondary | ICD-10-CM | POA: Diagnosis not present

## 2014-06-29 DIAGNOSIS — G458 Other transient cerebral ischemic attacks and related syndromes: Secondary | ICD-10-CM | POA: Diagnosis not present

## 2014-06-29 HISTORY — PX: RADIOLOGY WITH ANESTHESIA: SHX6223

## 2014-06-29 LAB — BASIC METABOLIC PANEL
ANION GAP: 15 (ref 5–15)
BUN: 20 mg/dL (ref 6–23)
CHLORIDE: 101 meq/L (ref 96–112)
CO2: 25 meq/L (ref 19–32)
Calcium: 9.4 mg/dL (ref 8.4–10.5)
Creatinine, Ser: 1.33 mg/dL — ABNORMAL HIGH (ref 0.50–1.10)
GFR calc Af Amer: 44 mL/min — ABNORMAL LOW (ref >90)
GFR calc non Af Amer: 38 mL/min — ABNORMAL LOW (ref >90)
Glucose, Bld: 76 mg/dL (ref 70–99)
POTASSIUM: 4.5 meq/L (ref 3.7–5.3)
SODIUM: 141 meq/L (ref 137–147)

## 2014-06-29 SURGERY — RADIOLOGY WITH ANESTHESIA
Anesthesia: General

## 2014-06-29 MED ORDER — LIDOCAINE HCL (CARDIAC) 20 MG/ML IV SOLN
INTRAVENOUS | Status: DC | PRN
  Administered 2014-06-29: 100 mg via INTRAVENOUS

## 2014-06-29 MED ORDER — LACTATED RINGERS IV SOLN
INTRAVENOUS | Status: DC | PRN
  Administered 2014-06-29: 08:00:00 via INTRAVENOUS

## 2014-06-29 MED ORDER — GADOBENATE DIMEGLUMINE 529 MG/ML IV SOLN
5.0000 mL | Freq: Once | INTRAVENOUS | Status: AC | PRN
  Administered 2014-06-29: 5 mL via INTRAVENOUS

## 2014-06-29 MED ORDER — FENTANYL CITRATE 0.05 MG/ML IJ SOLN
INTRAMUSCULAR | Status: DC | PRN
  Administered 2014-06-29: 100 ug via INTRAVENOUS

## 2014-06-29 MED ORDER — PROPOFOL 10 MG/ML IV BOLUS
INTRAVENOUS | Status: DC | PRN
  Administered 2014-06-29: 150 mg via INTRAVENOUS

## 2014-06-29 NOTE — Anesthesia Preprocedure Evaluation (Addendum)
Anesthesia Evaluation  Patient identified by MRN, date of birth, ID band Patient awake and Patient confused    Reviewed: Allergy & Precautions, H&P , NPO status , Patient's Chart, lab work & pertinent test results  Airway Mallampati: II TM Distance: >3 FB   Mouth opening: Limited Mouth Opening  Dental  (+) Teeth Intact, Dental Advisory Given   Pulmonary          Cardiovascular hypertension, Pt. on medications + dysrhythmias Atrial Fibrillation + Valvular Problems/Murmurs  Pt has H/O small ASD with Nl LV on Echo   Neuro/Psych    GI/Hepatic GERD-  Medicated and Controlled,  Endo/Other  Hypothyroidism   Renal/GU      Musculoskeletal   Abdominal   Peds  Hematology   Anesthesia Other Findings   Reproductive/Obstetrics                          Anesthesia Physical Anesthesia Plan  ASA: III  Anesthesia Plan: General   Post-op Pain Management:    Induction: Intravenous  Airway Management Planned: LMA  Additional Equipment:   Intra-op Plan:   Post-operative Plan:   Informed Consent: I have reviewed the patients History and Physical, chart, labs and discussed the procedure including the risks, benefits and alternatives for the proposed anesthesia with the patient or authorized representative who has indicated his/her understanding and acceptance.   Dental advisory given  Plan Discussed with: CRNA and Surgeon  Anesthesia Plan Comments:        Anesthesia Quick Evaluation

## 2014-06-29 NOTE — Anesthesia Procedure Notes (Signed)
Procedure Name: LMA Insertion Date/Time: 06/29/2014 8:42 AM Performed by: Melina Copa, Antionetta Ator R Pre-anesthesia Checklist: Patient identified, Emergency Drugs available, Suction available, Patient being monitored and Timeout performed Patient Re-evaluated:Patient Re-evaluated prior to inductionOxygen Delivery Method: Circle system utilized Preoxygenation: Pre-oxygenation with 100% oxygen Intubation Type: IV induction Ventilation: Mask ventilation without difficulty LMA: LMA inserted LMA Size: 4.0 Number of attempts: 1 Placement Confirmation: positive ETCO2 Tube secured with: Tape Dental Injury: Teeth and Oropharynx as per pre-operative assessment

## 2014-06-29 NOTE — Transfer of Care (Signed)
Immediate Anesthesia Transfer of Care Note  Patient: Beth Johnson  Procedure(s) Performed: Procedure(s): RADIOLOGY WITH ANESTHESIA - MRI (N/A)  Patient Location: PACU  Anesthesia Type:General  Level of Consciousness: awake, alert  and oriented  Airway & Oxygen Therapy: Patient Spontanous Breathing and Patient connected to nasal cannula oxygen  Post-op Assessment: Report given to PACU RN and Post -op Vital signs reviewed and stable  Post vital signs: Reviewed and stable  Complications: No apparent anesthesia complications

## 2014-06-30 ENCOUNTER — Encounter (HOSPITAL_COMMUNITY): Payer: Self-pay | Admitting: Radiology

## 2014-06-30 NOTE — Anesthesia Postprocedure Evaluation (Signed)
Anesthesia Post Note  Patient: Beth Johnson  Procedure(s) Performed: Procedure(s) (LRB): RADIOLOGY WITH ANESTHESIA - MRI (N/A)  Anesthesia type: general  Patient location: PACU  Post pain: Pain level controlled  Post assessment: Patient's Cardiovascular Status Stable  Last Vitals:  Filed Vitals:   06/29/14 1103  BP: 142/76  Pulse: 106  Temp:   Resp: 22    Post vital signs: Reviewed and stable  Level of consciousness: sedated  Complications: No apparent anesthesia complications

## 2014-08-03 DIAGNOSIS — Z23 Encounter for immunization: Secondary | ICD-10-CM | POA: Diagnosis not present

## 2014-08-04 ENCOUNTER — Other Ambulatory Visit: Payer: Self-pay | Admitting: Neurology

## 2014-09-04 ENCOUNTER — Other Ambulatory Visit: Payer: Self-pay | Admitting: Neurology

## 2014-09-07 DIAGNOSIS — M5136 Other intervertebral disc degeneration, lumbar region: Secondary | ICD-10-CM | POA: Diagnosis not present

## 2014-09-20 DIAGNOSIS — M25551 Pain in right hip: Secondary | ICD-10-CM | POA: Diagnosis not present

## 2014-09-20 DIAGNOSIS — M25552 Pain in left hip: Secondary | ICD-10-CM | POA: Diagnosis not present

## 2014-09-20 DIAGNOSIS — M545 Low back pain: Secondary | ICD-10-CM | POA: Diagnosis not present

## 2014-09-21 DIAGNOSIS — M545 Low back pain: Secondary | ICD-10-CM | POA: Diagnosis not present

## 2014-09-21 DIAGNOSIS — M6281 Muscle weakness (generalized): Secondary | ICD-10-CM | POA: Diagnosis not present

## 2014-09-21 DIAGNOSIS — M25551 Pain in right hip: Secondary | ICD-10-CM | POA: Diagnosis not present

## 2014-09-24 DIAGNOSIS — M6281 Muscle weakness (generalized): Secondary | ICD-10-CM | POA: Diagnosis not present

## 2014-09-24 DIAGNOSIS — M545 Low back pain: Secondary | ICD-10-CM | POA: Diagnosis not present

## 2014-09-24 DIAGNOSIS — M25551 Pain in right hip: Secondary | ICD-10-CM | POA: Diagnosis not present

## 2014-09-26 DIAGNOSIS — M6281 Muscle weakness (generalized): Secondary | ICD-10-CM | POA: Diagnosis not present

## 2014-09-26 DIAGNOSIS — M545 Low back pain: Secondary | ICD-10-CM | POA: Diagnosis not present

## 2014-09-26 DIAGNOSIS — M25551 Pain in right hip: Secondary | ICD-10-CM | POA: Diagnosis not present

## 2014-10-06 DIAGNOSIS — M6281 Muscle weakness (generalized): Secondary | ICD-10-CM | POA: Diagnosis not present

## 2014-10-06 DIAGNOSIS — M25551 Pain in right hip: Secondary | ICD-10-CM | POA: Diagnosis not present

## 2014-10-06 DIAGNOSIS — M545 Low back pain: Secondary | ICD-10-CM | POA: Diagnosis not present

## 2014-10-10 DIAGNOSIS — M6281 Muscle weakness (generalized): Secondary | ICD-10-CM | POA: Diagnosis not present

## 2014-10-10 DIAGNOSIS — M545 Low back pain: Secondary | ICD-10-CM | POA: Diagnosis not present

## 2014-10-10 DIAGNOSIS — M25551 Pain in right hip: Secondary | ICD-10-CM | POA: Diagnosis not present

## 2014-10-13 DIAGNOSIS — M545 Low back pain: Secondary | ICD-10-CM | POA: Diagnosis not present

## 2014-10-13 DIAGNOSIS — M6281 Muscle weakness (generalized): Secondary | ICD-10-CM | POA: Diagnosis not present

## 2014-10-13 DIAGNOSIS — M25551 Pain in right hip: Secondary | ICD-10-CM | POA: Diagnosis not present

## 2014-10-17 DIAGNOSIS — M6281 Muscle weakness (generalized): Secondary | ICD-10-CM | POA: Diagnosis not present

## 2014-10-17 DIAGNOSIS — M25551 Pain in right hip: Secondary | ICD-10-CM | POA: Diagnosis not present

## 2014-10-17 DIAGNOSIS — M545 Low back pain: Secondary | ICD-10-CM | POA: Diagnosis not present

## 2014-10-20 DIAGNOSIS — M545 Low back pain: Secondary | ICD-10-CM | POA: Diagnosis not present

## 2014-10-20 DIAGNOSIS — M6281 Muscle weakness (generalized): Secondary | ICD-10-CM | POA: Diagnosis not present

## 2014-10-20 DIAGNOSIS — M25551 Pain in right hip: Secondary | ICD-10-CM | POA: Diagnosis not present

## 2014-10-24 DIAGNOSIS — M25551 Pain in right hip: Secondary | ICD-10-CM | POA: Diagnosis not present

## 2014-10-24 DIAGNOSIS — M545 Low back pain: Secondary | ICD-10-CM | POA: Diagnosis not present

## 2014-10-24 DIAGNOSIS — M6281 Muscle weakness (generalized): Secondary | ICD-10-CM | POA: Diagnosis not present

## 2014-10-25 DIAGNOSIS — M25551 Pain in right hip: Secondary | ICD-10-CM | POA: Diagnosis not present

## 2014-10-25 DIAGNOSIS — M545 Low back pain: Secondary | ICD-10-CM | POA: Diagnosis not present

## 2014-10-25 DIAGNOSIS — M6281 Muscle weakness (generalized): Secondary | ICD-10-CM | POA: Diagnosis not present

## 2014-10-30 DIAGNOSIS — M545 Low back pain: Secondary | ICD-10-CM | POA: Diagnosis not present

## 2014-10-30 DIAGNOSIS — M6281 Muscle weakness (generalized): Secondary | ICD-10-CM | POA: Diagnosis not present

## 2014-10-30 DIAGNOSIS — M25551 Pain in right hip: Secondary | ICD-10-CM | POA: Diagnosis not present

## 2014-11-01 DIAGNOSIS — M545 Low back pain: Secondary | ICD-10-CM | POA: Diagnosis not present

## 2014-11-01 DIAGNOSIS — M6281 Muscle weakness (generalized): Secondary | ICD-10-CM | POA: Diagnosis not present

## 2014-11-01 DIAGNOSIS — M25551 Pain in right hip: Secondary | ICD-10-CM | POA: Diagnosis not present

## 2014-11-06 DIAGNOSIS — R262 Difficulty in walking, not elsewhere classified: Secondary | ICD-10-CM | POA: Diagnosis not present

## 2014-11-06 DIAGNOSIS — M6281 Muscle weakness (generalized): Secondary | ICD-10-CM | POA: Diagnosis not present

## 2014-11-06 DIAGNOSIS — M25551 Pain in right hip: Secondary | ICD-10-CM | POA: Diagnosis not present

## 2014-11-06 DIAGNOSIS — M545 Low back pain: Secondary | ICD-10-CM | POA: Diagnosis not present

## 2014-11-08 DIAGNOSIS — E039 Hypothyroidism, unspecified: Secondary | ICD-10-CM | POA: Diagnosis not present

## 2014-11-08 DIAGNOSIS — R197 Diarrhea, unspecified: Secondary | ICD-10-CM | POA: Diagnosis not present

## 2014-11-08 DIAGNOSIS — G301 Alzheimer's disease with late onset: Secondary | ICD-10-CM | POA: Diagnosis not present

## 2014-11-08 DIAGNOSIS — Z23 Encounter for immunization: Secondary | ICD-10-CM | POA: Diagnosis not present

## 2014-11-08 DIAGNOSIS — I10 Essential (primary) hypertension: Secondary | ICD-10-CM | POA: Diagnosis not present

## 2014-11-09 DIAGNOSIS — M6281 Muscle weakness (generalized): Secondary | ICD-10-CM | POA: Diagnosis not present

## 2014-11-09 DIAGNOSIS — M545 Low back pain: Secondary | ICD-10-CM | POA: Diagnosis not present

## 2014-11-09 DIAGNOSIS — R262 Difficulty in walking, not elsewhere classified: Secondary | ICD-10-CM | POA: Diagnosis not present

## 2014-11-09 DIAGNOSIS — M25551 Pain in right hip: Secondary | ICD-10-CM | POA: Diagnosis not present

## 2014-11-10 DIAGNOSIS — R262 Difficulty in walking, not elsewhere classified: Secondary | ICD-10-CM | POA: Diagnosis not present

## 2014-11-10 DIAGNOSIS — M545 Low back pain: Secondary | ICD-10-CM | POA: Diagnosis not present

## 2014-11-10 DIAGNOSIS — M25551 Pain in right hip: Secondary | ICD-10-CM | POA: Diagnosis not present

## 2014-11-10 DIAGNOSIS — M6281 Muscle weakness (generalized): Secondary | ICD-10-CM | POA: Diagnosis not present

## 2014-11-23 ENCOUNTER — Other Ambulatory Visit: Payer: Self-pay | Admitting: Neurology

## 2014-11-29 DIAGNOSIS — H5203 Hypermetropia, bilateral: Secondary | ICD-10-CM | POA: Diagnosis not present

## 2014-11-29 DIAGNOSIS — H40023 Open angle with borderline findings, high risk, bilateral: Secondary | ICD-10-CM | POA: Diagnosis not present

## 2014-11-29 DIAGNOSIS — H2513 Age-related nuclear cataract, bilateral: Secondary | ICD-10-CM | POA: Diagnosis not present

## 2014-12-10 DIAGNOSIS — Z9181 History of falling: Secondary | ICD-10-CM | POA: Diagnosis not present

## 2014-12-10 DIAGNOSIS — Z96643 Presence of artificial hip joint, bilateral: Secondary | ICD-10-CM | POA: Diagnosis not present

## 2014-12-10 DIAGNOSIS — Z853 Personal history of malignant neoplasm of breast: Secondary | ICD-10-CM | POA: Diagnosis not present

## 2014-12-10 DIAGNOSIS — I69398 Other sequelae of cerebral infarction: Secondary | ICD-10-CM | POA: Diagnosis not present

## 2014-12-10 DIAGNOSIS — Z7901 Long term (current) use of anticoagulants: Secondary | ICD-10-CM | POA: Diagnosis not present

## 2014-12-10 DIAGNOSIS — H9192 Unspecified hearing loss, left ear: Secondary | ICD-10-CM | POA: Diagnosis not present

## 2014-12-10 DIAGNOSIS — I6931 Cognitive deficits following cerebral infarction: Secondary | ICD-10-CM | POA: Diagnosis not present

## 2014-12-10 DIAGNOSIS — I69332 Monoplegia of upper limb following cerebral infarction affecting left dominant side: Secondary | ICD-10-CM | POA: Diagnosis not present

## 2014-12-10 DIAGNOSIS — I4891 Unspecified atrial fibrillation: Secondary | ICD-10-CM | POA: Diagnosis not present

## 2014-12-10 DIAGNOSIS — H5340 Unspecified visual field defects: Secondary | ICD-10-CM | POA: Diagnosis not present

## 2014-12-10 DIAGNOSIS — I129 Hypertensive chronic kidney disease with stage 1 through stage 4 chronic kidney disease, or unspecified chronic kidney disease: Secondary | ICD-10-CM | POA: Diagnosis not present

## 2014-12-10 DIAGNOSIS — G301 Alzheimer's disease with late onset: Secondary | ICD-10-CM | POA: Diagnosis not present

## 2014-12-10 DIAGNOSIS — I2510 Atherosclerotic heart disease of native coronary artery without angina pectoris: Secondary | ICD-10-CM | POA: Diagnosis not present

## 2014-12-10 DIAGNOSIS — N183 Chronic kidney disease, stage 3 (moderate): Secondary | ICD-10-CM | POA: Diagnosis not present

## 2014-12-10 DIAGNOSIS — F0280 Dementia in other diseases classified elsewhere without behavioral disturbance: Secondary | ICD-10-CM | POA: Diagnosis not present

## 2014-12-10 DIAGNOSIS — F329 Major depressive disorder, single episode, unspecified: Secondary | ICD-10-CM | POA: Diagnosis not present

## 2014-12-11 DIAGNOSIS — N390 Urinary tract infection, site not specified: Secondary | ICD-10-CM | POA: Diagnosis not present

## 2014-12-11 DIAGNOSIS — N183 Chronic kidney disease, stage 3 (moderate): Secondary | ICD-10-CM | POA: Diagnosis not present

## 2014-12-12 DIAGNOSIS — N183 Chronic kidney disease, stage 3 (moderate): Secondary | ICD-10-CM | POA: Diagnosis not present

## 2014-12-12 DIAGNOSIS — I129 Hypertensive chronic kidney disease with stage 1 through stage 4 chronic kidney disease, or unspecified chronic kidney disease: Secondary | ICD-10-CM | POA: Diagnosis not present

## 2014-12-12 DIAGNOSIS — G301 Alzheimer's disease with late onset: Secondary | ICD-10-CM | POA: Diagnosis not present

## 2014-12-12 DIAGNOSIS — I69332 Monoplegia of upper limb following cerebral infarction affecting left dominant side: Secondary | ICD-10-CM | POA: Diagnosis not present

## 2014-12-12 DIAGNOSIS — I6931 Cognitive deficits following cerebral infarction: Secondary | ICD-10-CM | POA: Diagnosis not present

## 2014-12-12 DIAGNOSIS — F0280 Dementia in other diseases classified elsewhere without behavioral disturbance: Secondary | ICD-10-CM | POA: Diagnosis not present

## 2014-12-21 DIAGNOSIS — F0280 Dementia in other diseases classified elsewhere without behavioral disturbance: Secondary | ICD-10-CM | POA: Diagnosis not present

## 2014-12-21 DIAGNOSIS — G301 Alzheimer's disease with late onset: Secondary | ICD-10-CM | POA: Diagnosis not present

## 2014-12-21 DIAGNOSIS — I69332 Monoplegia of upper limb following cerebral infarction affecting left dominant side: Secondary | ICD-10-CM | POA: Diagnosis not present

## 2014-12-21 DIAGNOSIS — N183 Chronic kidney disease, stage 3 (moderate): Secondary | ICD-10-CM | POA: Diagnosis not present

## 2014-12-21 DIAGNOSIS — I129 Hypertensive chronic kidney disease with stage 1 through stage 4 chronic kidney disease, or unspecified chronic kidney disease: Secondary | ICD-10-CM | POA: Diagnosis not present

## 2014-12-21 DIAGNOSIS — I6931 Cognitive deficits following cerebral infarction: Secondary | ICD-10-CM | POA: Diagnosis not present

## 2014-12-26 DIAGNOSIS — I6931 Cognitive deficits following cerebral infarction: Secondary | ICD-10-CM | POA: Diagnosis not present

## 2014-12-26 DIAGNOSIS — G301 Alzheimer's disease with late onset: Secondary | ICD-10-CM | POA: Diagnosis not present

## 2014-12-26 DIAGNOSIS — I69332 Monoplegia of upper limb following cerebral infarction affecting left dominant side: Secondary | ICD-10-CM | POA: Diagnosis not present

## 2014-12-26 DIAGNOSIS — I129 Hypertensive chronic kidney disease with stage 1 through stage 4 chronic kidney disease, or unspecified chronic kidney disease: Secondary | ICD-10-CM | POA: Diagnosis not present

## 2014-12-26 DIAGNOSIS — F0280 Dementia in other diseases classified elsewhere without behavioral disturbance: Secondary | ICD-10-CM | POA: Diagnosis not present

## 2014-12-26 DIAGNOSIS — N183 Chronic kidney disease, stage 3 (moderate): Secondary | ICD-10-CM | POA: Diagnosis not present

## 2015-01-01 DIAGNOSIS — L853 Xerosis cutis: Secondary | ICD-10-CM | POA: Diagnosis not present

## 2015-01-01 DIAGNOSIS — L718 Other rosacea: Secondary | ICD-10-CM | POA: Diagnosis not present

## 2015-01-01 DIAGNOSIS — D1801 Hemangioma of skin and subcutaneous tissue: Secondary | ICD-10-CM | POA: Diagnosis not present

## 2015-01-01 DIAGNOSIS — Z85828 Personal history of other malignant neoplasm of skin: Secondary | ICD-10-CM | POA: Diagnosis not present

## 2015-01-03 DIAGNOSIS — H40023 Open angle with borderline findings, high risk, bilateral: Secondary | ICD-10-CM | POA: Diagnosis not present

## 2015-01-18 DIAGNOSIS — Z8249 Family history of ischemic heart disease and other diseases of the circulatory system: Secondary | ICD-10-CM | POA: Diagnosis not present

## 2015-01-18 DIAGNOSIS — H2513 Age-related nuclear cataract, bilateral: Secondary | ICD-10-CM | POA: Diagnosis not present

## 2015-01-18 DIAGNOSIS — H1132 Conjunctival hemorrhage, left eye: Secondary | ICD-10-CM | POA: Diagnosis not present

## 2015-01-18 DIAGNOSIS — Z789 Other specified health status: Secondary | ICD-10-CM | POA: Diagnosis not present

## 2015-01-18 DIAGNOSIS — Z720 Tobacco use: Secondary | ICD-10-CM | POA: Diagnosis not present

## 2015-01-18 DIAGNOSIS — H52223 Regular astigmatism, bilateral: Secondary | ICD-10-CM | POA: Diagnosis not present

## 2015-01-18 DIAGNOSIS — H524 Presbyopia: Secondary | ICD-10-CM | POA: Diagnosis not present

## 2015-01-18 DIAGNOSIS — H5203 Hypermetropia, bilateral: Secondary | ICD-10-CM | POA: Diagnosis not present

## 2015-04-16 DIAGNOSIS — M25551 Pain in right hip: Secondary | ICD-10-CM | POA: Diagnosis not present

## 2015-04-16 DIAGNOSIS — R262 Difficulty in walking, not elsewhere classified: Secondary | ICD-10-CM | POA: Diagnosis not present

## 2015-04-16 DIAGNOSIS — M545 Low back pain: Secondary | ICD-10-CM | POA: Diagnosis not present

## 2015-04-16 DIAGNOSIS — M6281 Muscle weakness (generalized): Secondary | ICD-10-CM | POA: Diagnosis not present

## 2015-04-18 DIAGNOSIS — M545 Low back pain: Secondary | ICD-10-CM | POA: Diagnosis not present

## 2015-04-18 DIAGNOSIS — M25551 Pain in right hip: Secondary | ICD-10-CM | POA: Diagnosis not present

## 2015-04-18 DIAGNOSIS — M6281 Muscle weakness (generalized): Secondary | ICD-10-CM | POA: Diagnosis not present

## 2015-04-18 DIAGNOSIS — R262 Difficulty in walking, not elsewhere classified: Secondary | ICD-10-CM | POA: Diagnosis not present

## 2015-04-20 DIAGNOSIS — R262 Difficulty in walking, not elsewhere classified: Secondary | ICD-10-CM | POA: Diagnosis not present

## 2015-04-20 DIAGNOSIS — M6281 Muscle weakness (generalized): Secondary | ICD-10-CM | POA: Diagnosis not present

## 2015-04-20 DIAGNOSIS — M545 Low back pain: Secondary | ICD-10-CM | POA: Diagnosis not present

## 2015-04-20 DIAGNOSIS — M25551 Pain in right hip: Secondary | ICD-10-CM | POA: Diagnosis not present

## 2015-04-24 DIAGNOSIS — M6281 Muscle weakness (generalized): Secondary | ICD-10-CM | POA: Diagnosis not present

## 2015-04-24 DIAGNOSIS — M545 Low back pain: Secondary | ICD-10-CM | POA: Diagnosis not present

## 2015-04-24 DIAGNOSIS — R262 Difficulty in walking, not elsewhere classified: Secondary | ICD-10-CM | POA: Diagnosis not present

## 2015-04-24 DIAGNOSIS — M25551 Pain in right hip: Secondary | ICD-10-CM | POA: Diagnosis not present

## 2015-04-27 DIAGNOSIS — M545 Low back pain: Secondary | ICD-10-CM | POA: Diagnosis not present

## 2015-04-27 DIAGNOSIS — M25551 Pain in right hip: Secondary | ICD-10-CM | POA: Diagnosis not present

## 2015-04-27 DIAGNOSIS — M6281 Muscle weakness (generalized): Secondary | ICD-10-CM | POA: Diagnosis not present

## 2015-04-27 DIAGNOSIS — R262 Difficulty in walking, not elsewhere classified: Secondary | ICD-10-CM | POA: Diagnosis not present

## 2015-05-01 DIAGNOSIS — M545 Low back pain: Secondary | ICD-10-CM | POA: Diagnosis not present

## 2015-05-01 DIAGNOSIS — M25551 Pain in right hip: Secondary | ICD-10-CM | POA: Diagnosis not present

## 2015-05-01 DIAGNOSIS — R262 Difficulty in walking, not elsewhere classified: Secondary | ICD-10-CM | POA: Diagnosis not present

## 2015-05-01 DIAGNOSIS — M6281 Muscle weakness (generalized): Secondary | ICD-10-CM | POA: Diagnosis not present

## 2015-05-04 DIAGNOSIS — R262 Difficulty in walking, not elsewhere classified: Secondary | ICD-10-CM | POA: Diagnosis not present

## 2015-05-04 DIAGNOSIS — M25551 Pain in right hip: Secondary | ICD-10-CM | POA: Diagnosis not present

## 2015-05-04 DIAGNOSIS — M6281 Muscle weakness (generalized): Secondary | ICD-10-CM | POA: Diagnosis not present

## 2015-05-04 DIAGNOSIS — M545 Low back pain: Secondary | ICD-10-CM | POA: Diagnosis not present

## 2015-05-09 DIAGNOSIS — M25551 Pain in right hip: Secondary | ICD-10-CM | POA: Diagnosis not present

## 2015-05-09 DIAGNOSIS — M545 Low back pain: Secondary | ICD-10-CM | POA: Diagnosis not present

## 2015-05-09 DIAGNOSIS — M6281 Muscle weakness (generalized): Secondary | ICD-10-CM | POA: Diagnosis not present

## 2015-05-09 DIAGNOSIS — R262 Difficulty in walking, not elsewhere classified: Secondary | ICD-10-CM | POA: Diagnosis not present

## 2015-05-11 DIAGNOSIS — M545 Low back pain: Secondary | ICD-10-CM | POA: Diagnosis not present

## 2015-05-11 DIAGNOSIS — M25551 Pain in right hip: Secondary | ICD-10-CM | POA: Diagnosis not present

## 2015-05-11 DIAGNOSIS — R262 Difficulty in walking, not elsewhere classified: Secondary | ICD-10-CM | POA: Diagnosis not present

## 2015-05-11 DIAGNOSIS — M6281 Muscle weakness (generalized): Secondary | ICD-10-CM | POA: Diagnosis not present

## 2015-05-14 DIAGNOSIS — M6281 Muscle weakness (generalized): Secondary | ICD-10-CM | POA: Diagnosis not present

## 2015-05-14 DIAGNOSIS — M25551 Pain in right hip: Secondary | ICD-10-CM | POA: Diagnosis not present

## 2015-05-14 DIAGNOSIS — M545 Low back pain: Secondary | ICD-10-CM | POA: Diagnosis not present

## 2015-05-14 DIAGNOSIS — R262 Difficulty in walking, not elsewhere classified: Secondary | ICD-10-CM | POA: Diagnosis not present

## 2015-06-11 ENCOUNTER — Telehealth: Payer: Self-pay | Admitting: Cardiology

## 2015-06-11 NOTE — Telephone Encounter (Signed)
Patient has been having problems with bp x 3 wks .  Having been having problems with Chest pain and H/A.  BP was 143/111 was the last reading.

## 2015-06-11 NOTE — Telephone Encounter (Signed)
Spoke with daughter and patient is living in [place] now Per daughter patient has been having elevated blood pressure and headache, no other symptoms Advised to contact PCP

## 2015-06-11 NOTE — Telephone Encounter (Signed)
Spoke with daughter and she did speak with PCP and they increased Diltiazem to 60 mg three times a day  Daughter did want for Dr Marlou Porch to review and make sure no other recommendations Also she wanted patient to see Dr Marlou Porch when she returns from vacation 8/19 Will forward to Dr Marlou Porch for review

## 2015-06-12 NOTE — Telephone Encounter (Signed)
Agree with plan to increase diltiazem. Monitor BP. Would be happy to see her in clinic.  Candee Furbish, MD

## 2015-06-13 ENCOUNTER — Other Ambulatory Visit: Payer: Self-pay

## 2015-06-13 ENCOUNTER — Encounter (HOSPITAL_COMMUNITY): Payer: Self-pay

## 2015-06-13 ENCOUNTER — Emergency Department (HOSPITAL_COMMUNITY)
Admission: EM | Admit: 2015-06-13 | Discharge: 2015-06-13 | Disposition: A | Discharge status: Home / Self Care | Payer: Medicare Other | Attending: Emergency Medicine | Admitting: Emergency Medicine

## 2015-06-13 ENCOUNTER — Emergency Department (HOSPITAL_COMMUNITY): Payer: Medicare Other

## 2015-06-13 DIAGNOSIS — I4892 Unspecified atrial flutter: Secondary | ICD-10-CM | POA: Insufficient documentation

## 2015-06-13 DIAGNOSIS — Z8719 Personal history of other diseases of the digestive system: Secondary | ICD-10-CM | POA: Diagnosis not present

## 2015-06-13 DIAGNOSIS — R079 Chest pain, unspecified: Secondary | ICD-10-CM | POA: Diagnosis not present

## 2015-06-13 DIAGNOSIS — I482 Chronic atrial fibrillation, unspecified: Secondary | ICD-10-CM

## 2015-06-13 DIAGNOSIS — Z79899 Other long term (current) drug therapy: Secondary | ICD-10-CM | POA: Insufficient documentation

## 2015-06-13 DIAGNOSIS — E039 Hypothyroidism, unspecified: Secondary | ICD-10-CM | POA: Diagnosis not present

## 2015-06-13 DIAGNOSIS — Z880 Allergy status to penicillin: Secondary | ICD-10-CM | POA: Diagnosis not present

## 2015-06-13 DIAGNOSIS — F329 Major depressive disorder, single episode, unspecified: Secondary | ICD-10-CM | POA: Diagnosis not present

## 2015-06-13 DIAGNOSIS — Z8673 Personal history of transient ischemic attack (TIA), and cerebral infarction without residual deficits: Secondary | ICD-10-CM | POA: Insufficient documentation

## 2015-06-13 DIAGNOSIS — R51 Headache: Secondary | ICD-10-CM | POA: Diagnosis not present

## 2015-06-13 DIAGNOSIS — I10 Essential (primary) hypertension: Secondary | ICD-10-CM | POA: Diagnosis not present

## 2015-06-13 DIAGNOSIS — R519 Headache, unspecified: Secondary | ICD-10-CM

## 2015-06-13 DIAGNOSIS — I499 Cardiac arrhythmia, unspecified: Secondary | ICD-10-CM | POA: Diagnosis not present

## 2015-06-13 DIAGNOSIS — G8929 Other chronic pain: Secondary | ICD-10-CM | POA: Diagnosis not present

## 2015-06-13 DIAGNOSIS — Z1231 Encounter for screening mammogram for malignant neoplasm of breast: Secondary | ICD-10-CM

## 2015-06-13 HISTORY — DX: Essential (primary) hypertension: I10

## 2015-06-13 LAB — URINE MICROSCOPIC-ADD ON

## 2015-06-13 LAB — BASIC METABOLIC PANEL
Anion gap: 9 (ref 5–15)
BUN: 17 mg/dL (ref 6–20)
CHLORIDE: 106 mmol/L (ref 101–111)
CO2: 22 mmol/L (ref 22–32)
CREATININE: 1.65 mg/dL — AB (ref 0.44–1.00)
Calcium: 9 mg/dL (ref 8.9–10.3)
GFR calc Af Amer: 34 mL/min — ABNORMAL LOW (ref >60)
GFR calc non Af Amer: 29 mL/min — ABNORMAL LOW (ref >60)
GLUCOSE: 85 mg/dL (ref 65–99)
Potassium: 4.5 mmol/L (ref 3.5–5.1)
Sodium: 137 mmol/L (ref 135–145)

## 2015-06-13 LAB — CBC
HCT: 37.4 % (ref 36.0–46.0)
HEMOGLOBIN: 12.5 g/dL (ref 12.0–15.0)
MCH: 34.7 pg — ABNORMAL HIGH (ref 26.0–34.0)
MCHC: 33.4 g/dL (ref 30.0–36.0)
MCV: 103.9 fL — ABNORMAL HIGH (ref 78.0–100.0)
PLATELETS: 225 10*3/uL (ref 150–400)
RBC: 3.6 MIL/uL — AB (ref 3.87–5.11)
RDW: 12.6 % (ref 11.5–15.5)
WBC: 6.2 10*3/uL (ref 4.0–10.5)

## 2015-06-13 LAB — URINALYSIS, ROUTINE W REFLEX MICROSCOPIC
BILIRUBIN URINE: NEGATIVE
GLUCOSE, UA: NEGATIVE mg/dL
Ketones, ur: NEGATIVE mg/dL
Nitrite: POSITIVE — AB
PH: 5 (ref 5.0–8.0)
Protein, ur: NEGATIVE mg/dL
Specific Gravity, Urine: 1.008 (ref 1.005–1.030)
Urobilinogen, UA: 0.2 mg/dL (ref 0.0–1.0)

## 2015-06-13 LAB — I-STAT TROPONIN, ED: Troponin i, poc: 0 ng/mL (ref 0.00–0.08)

## 2015-06-13 MED ORDER — CIPROFLOXACIN HCL 500 MG PO TABS: 500.0000 mg | ORAL_TABLET | Freq: Two times a day (BID) | ORAL | Status: DC

## 2015-06-13 NOTE — ED Notes (Signed)
Patient transported to X-ray 

## 2015-06-13 NOTE — ED Notes (Signed)
Pt sent by [facility] physicians, c/o central chest pain for three weeks and bilateral head pain for two weeks.  Pt currently denies chest pain, but c/o head pain at both temples.  Pt denies any radiating pain, no numbness or weakness, alert and oriented at baseline per daughter.

## 2015-06-13 NOTE — ED Provider Notes (Signed)
The patient is a 74 year old female, she has no known history of obstructive heart disease though she does have an arrhythmia history with atrial fib or atrial flutter. She presents today with intermittent chest pain and intermittent headaches which have been going on for some time, possibly months, she does not have those symptoms at this time. On exam the patient has an irregularly irregular rhythm, her EKG does show atrial flutter with variable block, troponin negative, CBC normal, no peripheral edema or JVD. The patient appears comfortable, she can follow-up with Dr. Marlou Porch with whom she has already established care in the past. She may need an outpatient stress test but at this time is low risk for acute cardiac ischemia. The plan was discussed with the patient and her son who are in agreement. Her urinalysis is pending as she does have cloudy appearing urine.  Medical screening examination/treatment/procedure(s) were conducted as a shared visit with non-physician practitioner(s) and myself.  I personally evaluated the patient during the encounter.  Clinical Impression:   Final diagnoses:  Nonintractable headache, unspecified chronicity pattern, unspecified headache type  Chronic atrial fibrillation         Noemi Chapel, MD 06/13/15 1825

## 2015-06-13 NOTE — ED Provider Notes (Signed)
CSN: [id number]     Arrival date & time 06/13/15  1543 History   First MD Initiated Contact with Patient 06/13/15 1545     Chief Complaint  Patient presents with  . Chest Pain  . Headache     (Consider location/radiation/quality/duration/timing/severity/associated sxs/prior Treatment) HPI Comments: Pt is a 74 yo female who presents to the ED with her daughter complaining of headache and CP. Pt reports that she started having midsternal chest "discomfort" appx. 3 weeks ago that is intermittent and improves with Tylenol. She notes that she currently does not have any chest pain/discomfort. Pt reports that she also started having a headache that coincided with the chest discomfort appx. 1 week ago. She states that the headache is located at there posterior scalp and sometimes temporal region. She also notes the head "discomfort" improves with Tylenol. The pt's daughter reports that the pt lives at a nursing home and states that her diastolic BP increased to the 110s which resulted in her cardiologist (Dr. Marlou Porch) increasing her Diltiazem from 30mg  to 60mg  TID. The daughter states that the pt's diastolic BP has decreased into the 70s/80s and she is worried because the pt's diastolic pressure is typically in the 90s. Pt and daughter report that the pt has not had any other symptoms present or any changes in daily activity.   Past Medical History  Diagnosis Date  . CVA (cerebral infarction)     new onset A. fib with CVA 09/2013: LUE weakness, left visual field deficit, facial draw, altered cognition  . Atrial fibrillation   . Depression   . Hypothyroidism   . Dysrhythmia   . Stroke 09/29/13    affected memory and comprehension  . GERD (gastroesophageal reflux disease)   . Hypertension    Past Surgical History  Procedure Laterality Date  . Abdominal hysterectomy    . Bil hip replacement ( 2 on ? left)    . Appendectomy    . Joint replacement Bilateral     Bilateral hips  . Colonoscopy     . Upper gi endoscopy    . Cholecystectomy N/A 02/06/2014    Procedure: LAPAROSCOPIC CHOLECYSTECTOMY WITH INTRAOPERATIVE CHOLANGIOGRAM;  Surgeon: Imogene Burn. Georgette Dover, MD;  Location: [facility];  Service: General;  Laterality: N/A;  . Radiology with anesthesia N/A 06/29/2014    Procedure: RADIOLOGY WITH ANESTHESIA - MRI;  Surgeon: Medication Radiologist, MD;  Location: [facility];  Service: Radiology;  Laterality: N/A;   Family History  Problem Relation Age of Onset  . Heart attack Father   . CAD Father   . Gallstones Mother   . Cancer Mother    Social History  Substance Use Topics  . Smoking status: Never Smoker   . Smokeless tobacco: None  . Alcohol Use: 1.2 oz/week    2 Glasses of wine per week     Comment: occasional   OB History    No data available     Review of Systems  Cardiovascular: Positive for chest pain.  Neurological: Positive for headaches.  All other systems reviewed and are negative.     Allergies  Penicillins and Sulfa antibiotics  Home Medications   Prior to Admission medications   Medication Sig Start Date End Date Taking? Authorizing Provider  Calcium Citrate (CITRACAL PO) Take 2 tablets by mouth daily.    Historical Provider, MD  clobetasol (TEMOVATE) 0.05 % external solution Apply 1 application topically 2 (two) times daily as needed (dry scalp).    Historical Provider, MD  diltiazem (  CARDIZEM) 30 MG tablet Take 60 mg by mouth 3 (three) times daily.     Historical Provider, MD  donepezil (ARICEPT) 10 MG tablet TAKE 1 TABLET AT BEDTIME 09/04/14   Garvin Fila, MD  escitalopram (LEXAPRO) 20 MG tablet Take 20 mg by mouth daily.    Historical Provider, MD  ferrous sulfate 325 (65 FE) MG tablet Take 325 mg by mouth 2 (two) times daily with a meal.    Historical Provider, MD  ipratropium (ATROVENT) 0.06 % nasal spray Place 2 sprays into both nostrils 2 (two) times daily as needed (post nasal drip).    Historical Provider, MD  levothyroxine (SYNTHROID, LEVOTHROID) 50  MCG tablet Take 50 mcg by mouth at bedtime.     Historical Provider, MD  loperamide (HM LOPERAMIDE HCL) 2 MG capsule Take 2 mg by mouth daily.  05/25/14   Historical Provider, MD  Methylfol-Methylcob-Acetylcyst (CEREFOLIN NAC) 6-2-600 MG TABS Take one tablet by mouth one time daily 11/25/14   Garvin Fila, MD  metroNIDAZOLE (METROGEL) 0.75 % gel Apply 1 application topically 2 (two) times daily as needed (rosacia).    Historical Provider, MD  polyethylene glycol (MIRALAX / GLYCOLAX) packet Take 17 g by mouth daily.    Historical Provider, MD  Rivaroxaban (XARELTO) 15 MG TABS tablet Take 15 mg by mouth daily with supper.    Historical Provider, MD   BP 127/88 mmHg  Pulse 59  Temp(Src) 98 F (36.7 C) (Oral)  Resp 17  Ht 5\' 2"  (1.575 m)  Wt 124 lb (56.246 kg)  BMI 22.67 kg/m2  SpO2 100% Physical Exam  Constitutional: She is oriented to person, place, and time. She appears well-developed and well-nourished.  HENT:  Head: Normocephalic and atraumatic.  Eyes: Conjunctivae and EOM are normal. Pupils are equal, round, and reactive to light. Right eye exhibits no discharge. Left eye exhibits no discharge. No scleral icterus.  Cardiovascular: Regular rhythm, normal heart sounds and intact distal pulses.   Pt is in a-flutter.  Pulmonary/Chest: Effort normal and breath sounds normal. She exhibits no tenderness.  Abdominal: Soft. Bowel sounds are normal. There is no tenderness.  Musculoskeletal: She exhibits no edema.  Neurological: She is alert and oriented to person, place, and time. No cranial nerve deficit. Coordination normal.  Skin: Skin is warm and dry.    ED Course  Procedures (including critical care time) Labs Review Labs Reviewed - No data to display  Imaging Review No results found.   EKG Interpretation   Date/Time:  Wednesday June 13 2015 16:02:57 EDT Ventricular Rate:  55 PR Interval:    QRS Duration: 79 QT Interval:  429 QTC Calculation: 410 R Axis:   -21 Text  Interpretation:  Atrial flutter Borderline left axis deviation  Nonspecific T abnrm, anterolateral leads No old tracing to compare  Abnormal ekg Confirmed by MILLER  MD, BRIAN ([id number]) on 06/13/2015 6:05:05  PM      MDM   Final diagnoses:  Nonintractable headache, unspecified chronicity pattern, unspecified headache type  Chronic atrial fibrillation    Pt presents with history of headache and chest pain. Pt currently in a-flutter. BP stable at 127/88. EKG, trop and labs ordered.   EKG, CXR and labs reviewed. Negative troponin. Pt reports she has been having mild dysuria and cloudy urine. UA and urine culture ordered.   UA reviewed, pt will be treated for a UTI.  Plan to d/c pt and follow up with Cardiologist this week.   Discussed results and plan  with pt and family who agree with plan and to follow up with Cardiologist w/in a week. Pt advised to continue taking Tylenol for pain.   Chesley Noon [place], [place] 06/13/15 1937  Noemi Chapel, MD 06/14/15 [phone]

## 2015-06-13 NOTE — ED Notes (Signed)
PA at bedside.

## 2015-06-14 NOTE — Telephone Encounter (Signed)
Pt has an appt scheduled for 8/12 at 8:30.

## 2015-06-15 ENCOUNTER — Ambulatory Visit (INDEPENDENT_AMBULATORY_CARE_PROVIDER_SITE_OTHER): Payer: Medicare Other | Admitting: Cardiology

## 2015-06-15 ENCOUNTER — Encounter: Payer: Self-pay | Admitting: Cardiology

## 2015-06-15 VITALS — BP 126/88 | HR 93 | Wt 122.0 lb

## 2015-06-15 DIAGNOSIS — I4892 Unspecified atrial flutter: Secondary | ICD-10-CM | POA: Diagnosis not present

## 2015-06-15 DIAGNOSIS — R0789 Other chest pain: Secondary | ICD-10-CM | POA: Diagnosis not present

## 2015-06-15 DIAGNOSIS — Z8673 Personal history of transient ischemic attack (TIA), and cerebral infarction without residual deficits: Secondary | ICD-10-CM

## 2015-06-15 DIAGNOSIS — I10 Essential (primary) hypertension: Secondary | ICD-10-CM

## 2015-06-15 DIAGNOSIS — R9431 Abnormal electrocardiogram [ECG] [EKG]: Secondary | ICD-10-CM | POA: Diagnosis not present

## 2015-06-15 NOTE — Progress Notes (Signed)
[facility]. [address], [place]  [address] Phone: [phone] Fax:  [phone]  Date:  06/15/2015   ID:  Beth Johnson, DOB 04-19-1941, MRN [id number]  PCP:  Sandi Mariscal, MD   History of Present Illness: Beth Johnson is a 74 y.o. female status post CVA at rehabilitation facility here for follow up of atrial flutter.  After recent telephone conversation on 06/11/15, daughter spoke with primary physician and increased diltiazem to 60 mg 3 times a day secondary to problems with blood pressure, for instance was 143/111. Had some chest discomfort and headache as well. She is currently residing in [facility]. Currently on Xarelto 15 mg a day.  Review of [name] note and this is a portion of it: "Pt was found on Thanksgiving 2014 in her home confused and with slurred speech, left facial draw and left sided weakness after she did not respond to calls all day. She was admitted as [facility] in [place] that evening for an acute stroke. Assessment concluded it had likely occured in the prior 24-48 hours based on her CT findings and exam. She was also found to be in new onset a.fib.  She had LUE weakness, facial draw, confusion and some recall issues and left peripheral visual field deficits. She made tremendous gains while in the hospital, and at this point has regained all of her sight and most of her LUE strength. Still some ongoing issues with memory and recall.  However, according to her daughter there were already some preceding memory concerns prior to the CVA that had raised the question of an early dementia, and she would like to evaluate this further while her mom is here in [place]. Her mother has been reluctant to discuss or acknowledge this but has admitted to writing notes to herself to remember items and some forgetfulness when driving in unfamiliar places. Her daughter privately shared that she has noted noticeable forgetfulness in  conversation, repetitious conversations, a flattening of affect and at times a sense that her mom is ignoring or cannot attend to a conversation. To this point there have been no major incidents or safety issues till the stroke. She did not/could not answer her phone all day on Thanksgiving when she was home alone till her sister finally reached her that night and called police to the home along with EMS. Daughter states that her driving is restricted to a small repetitive course of familiar places in her hometown. Pt did have some sundowning in the hospital. 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  Her daughter would like to proceed with initiating evals with neurology for CVA follow up and memory issues , as well as a cardiologist. Pt is on Xarelto starting today- 15mg .  Pt has had a full therapy assessment at [facility] on which she performed quite well. They don't expect her to need to be there more than a week or so. She had a Berg balance scale and got 55/56. Main PT/OT deficits identified were some balance issues. Has almost full function of and strength in her LUE. There have been no swallowing i or choking ssues. She does have some partial aphasia and word findings issues at times and has been more forgetful , flat and confused at times  since the stroke. Her daughter is also concerned she has been depressed as well before the stroke and mentions she and another family member have done well on lexapro.   They hope to transition Ms. Peraza to their home here in [place] while she is unable to drive after SNF discharge and get home therapy is appropriate with the ultimate goal of restoring her level of functioning such that she can be cleared to drive and return home.She is not a vegetarian. She eats  chicken and fish, PB and lunch meat. Diet is fairly repetitious but pretty healthy. No prior hx of anemia.  Atrial Fibrillation F/U:  c/o Palpitations no. She has picked up her scrip for Xarelto 15 mg today. She is not on any rate controlling medications. She has no other complaints and her daughter has not observed any breathlessness, DOE, chest pain, dizziness or syncope. Pt denies all of the above as well. She did not have any known cardiac hx prior to the stroke that her daughter is aware of."  In May 2014 PCP saw her and dx of AFIB was made according to daughter. Recent ECHO Normal EF. HR mildly elevated in hospital. She currently is asymptomatic in regards to her atrial fibrillation. She does not realize that she is in atrial fibrillation. According to notes, there was a prior history of atrial septal defect that was not noted/seen on her bubble study.   In other words I assume her bubble study did not demonstrate shunt. Injection of contrast documented no intra-atrial shunt. Mild MR/mild TR. There were no significant narrowing of the neck arteries. Stroke occurred in subacute cortical infarction of right frontal convexity. Chest x-ray was unremarkable. Her daughter present for discussion is very integral in her care. Normally her mother resides in [facility] now.  It has been about 2 years since I saw her last.   She went to the emergency room because of headache, atypical chest pain, shortness in the epigastric region. In the past she has had pain in this region and gallbladder was removed. They were concerned because blood pressure was elevated and T waves were inverted on EKG. Dr. Felipa Eth saw her in the office setting.  Since ER visit, her diltiazem was increased to 60 mg 3 times a day. She is doing well with this. She is not having any further chest discomfort and she is quite active at her assisted living facility in [facility].   Wt Readings from Last 3 Encounters:  06/15/15 122 lb  (55.339 kg)  06/13/15 124 lb (56.246 kg)  06/29/14 120 lb (54.432 kg)     Past Medical History  Diagnosis Date  . CVA (cerebral infarction)     new onset A. fib with CVA 09/2013: LUE weakness, left visual field deficit, facial draw, altered cognition  . Atrial fibrillation   . Depression   . Hypothyroidism   . Dysrhythmia   . Stroke 09/29/13    affected memory and comprehension  . GERD (gastroesophageal reflux disease)   . Hypertension     Past Surgical History  Procedure Laterality Date  . Abdominal hysterectomy    . Bil hip replacement ( 2 on ? left)    . Appendectomy    . Joint replacement Bilateral     Bilateral hips  . Colonoscopy    . Upper gi endoscopy    . Cholecystectomy N/A 02/06/2014    Procedure: LAPAROSCOPIC CHOLECYSTECTOMY WITH INTRAOPERATIVE CHOLANGIOGRAM;  Surgeon: Imogene Burn. Georgette Dover, MD;  Location: [facility];  Service: General;  Laterality: N/A;  . Radiology with anesthesia N/A 06/29/2014    Procedure: RADIOLOGY WITH ANESTHESIA - MRI;  Surgeon: Medication Radiologist, MD;  Location: [facility];  Service: Radiology;  Laterality: N/A;    Current Outpatient Prescriptions  Medication Sig Dispense Refill  . BIOTIN PO Take 1 tablet by mouth daily.    . Calcium Citrate (CITRACAL PO) Take 2 tablets by mouth daily.    . cholestyramine (QUESTRAN) 4 GM/DOSE powder Take 4 g by mouth daily.    . ciprofloxacin (CIPRO) 500 MG tablet Take 1 tablet (500 mg total) by mouth every 12 (twelve) hours. 20 tablet 0  . clobetasol (TEMOVATE) 0.05 % external solution Apply 1 application topically 2 (two) times daily as needed (dry scalp).    Marland Kitchen diltiazem (CARDIZEM) 30 MG tablet Take 60 mg by mouth 3 (three) times daily.     Marland Kitchen donepezil (ARICEPT) 10 MG tablet TAKE 1 TABLET AT BEDTIME 90 tablet 0  . escitalopram (LEXAPRO) 20 MG tablet Take 20 mg by mouth daily.    . ferrous sulfate 325 (65 FE) MG tablet Take 325 mg by mouth 2 (two) times daily with a meal.    . ipratropium (ATROVENT) 0.06 % nasal  spray Place 2 sprays into both nostrils 2 (two) times daily as needed (post nasal drip).    Marland Kitchen levothyroxine (SYNTHROID, LEVOTHROID) 50 MCG tablet Take 50 mcg by mouth at bedtime.     Marland Kitchen loperamide (HM LOPERAMIDE HCL) 2 MG capsule Take 2 mg by mouth at bedtime.     . Methylfol-Methylcob-Acetylcyst (CEREFOLIN NAC) 6-2-600 MG TABS Take one tablet by mouth one time daily 30 each 0  . metroNIDAZOLE (METROGEL) 0.75 % gel Apply 1 application topically 2 (two) times daily as needed (rosacia).    . Rivaroxaban (XARELTO) 15 MG TABS tablet Take 15 mg by mouth daily at 12 noon.      No current facility-administered medications for this visit.    Allergies:    Allergies  Allergen Reactions  . Amoxicillin Other (See Comments)    unknown  . Latex Itching and Rash  . Penicillins Other (See Comments)    unknown  . Sulfa Antibiotics Other (See Comments)    unknown    Social History:  The patient  reports that she has never smoked. She does not have any smokeless tobacco history on file. She reports that she drinks about 1.2 oz of alcohol per week. She reports that she does not use illicit drugs.   Family History  Problem Relation Age of Onset  . Heart attack Father   . CAD Father   . Gallstones Mother   . Cancer Mother     ROS:  Please see the history of present illness.   Positive for stroke, no bleeding, no syncope, no chest pain, no shortness of breath   All other systems reviewed and negative.   PHYSICAL EXAM: VS:  BP 126/88 mmHg  Pulse 93  Wt 122 lb (55.339 kg)  SpO2 97% Well nourished, well developed, in no acute distress HEENT: normal, Leslie/AT, EOMI Neck: no JVD, normal carotid upstroke, no bruit Cardiac: Irregularly irregular, rate 70 no murmur Lungs:  clear to auscultation bilaterally, no wheezing, rhonchi or rales Abd: soft, nontender, no hepatomegaly, no bruits Ext: no edema, 2+ distal pulses Skin: warm and dry GU: deferred Neuro: no focal abnormalities noted, AAO x 3  EKG:   Atrial fibrillation/flutter heart rate 90, no other abnormalities  ASSESSMENT AND PLAN:  1.  Atypical chest pain-T wave inversion noted in lateral leads-prompted emergency room visit. Also concomitant headache. Hypertension. She ambulates very frequently at her [employer] residence and has not had any exertional anginal symptoms. If symptoms return, we will have low threshold for stress testing. I discussed with she and daughter today that we could go ahead and order stress test now but all parties or willing to monitor medically at this point. 2. Essential hypertension-doing well with increased diltiazem 60 mg 3 times a day him a short acting. We will continue with this. 3. Atrial fibrillation-likely permanent, has been in since 5/14, asymptomatic. T-wave inversion on 06/13/15 EKG was new. We will continue with rate control strategy. Long acting diltiazem did not seem to be as effective for her. She is currently taking diltiazem 60 mg 3 times a day. Dr. Felipa Eth. Her heart rate currently is in the 70s. She is doing well.. We had lengthy discussion about rate versus rhythm control. Given her persistent nature of atrial fibrillation/flutter and asymptomatic response , we will continue with rate control. I explained that it is likely that either cardioversion would be unsuccessful or shortly after cardioversion, atrial fibrillation may return. Also regardless of ability to take her out of atrial fibrillation, she still needs anticoagulation. 4. Chronic anticoagulation-continue with Xarelto.  Currently at 15 mg because of chronic kidney disease. We will continue to monitor. Recent creatinine has been 1.57. 5. History of stroke - this occurred over Thanksgiving, unexpected, likely secondary to atrial fibrillation. They did not wish to take statin therapy. We discussed the pros and cons. Given the likely embolic source and normal LDL, no statin seems like a reasonable approach. 6. We will see back in 6  months.   Signed, Candee Furbish, MD [facility]  06/15/2015 9:31 AM

## 2015-06-15 NOTE — Patient Instructions (Signed)
Medication Instructions:  Your physician recommends that you continue on your current medications as directed. Please refer to the Current Medication list given to you today.  Follow-Up: Follow up in 6 months with Dr. Skains.  You will receive a letter in the mail 2 months before you are due.  Please call us when you receive this letter to schedule your follow up appointment.  Thank you for choosing [facility]!!     

## 2015-06-16 LAB — URINE CULTURE: Culture: >=100000

## 2015-06-17 DIAGNOSIS — Z7901 Long term (current) use of anticoagulants: Secondary | ICD-10-CM | POA: Diagnosis not present

## 2015-06-17 DIAGNOSIS — Z9181 History of falling: Secondary | ICD-10-CM | POA: Diagnosis not present

## 2015-06-17 DIAGNOSIS — N183 Chronic kidney disease, stage 3 (moderate): Secondary | ICD-10-CM | POA: Diagnosis not present

## 2015-06-17 DIAGNOSIS — I6931 Cognitive deficits following cerebral infarction: Secondary | ICD-10-CM | POA: Diagnosis not present

## 2015-06-17 DIAGNOSIS — I69334 Monoplegia of upper limb following cerebral infarction affecting left non-dominant side: Secondary | ICD-10-CM | POA: Diagnosis not present

## 2015-06-17 DIAGNOSIS — I4891 Unspecified atrial fibrillation: Secondary | ICD-10-CM | POA: Diagnosis not present

## 2015-06-17 DIAGNOSIS — F0280 Dementia in other diseases classified elsewhere without behavioral disturbance: Secondary | ICD-10-CM | POA: Diagnosis not present

## 2015-06-17 DIAGNOSIS — H9192 Unspecified hearing loss, left ear: Secondary | ICD-10-CM | POA: Diagnosis not present

## 2015-06-17 DIAGNOSIS — F419 Anxiety disorder, unspecified: Secondary | ICD-10-CM | POA: Diagnosis not present

## 2015-06-17 DIAGNOSIS — I129 Hypertensive chronic kidney disease with stage 1 through stage 4 chronic kidney disease, or unspecified chronic kidney disease: Secondary | ICD-10-CM | POA: Diagnosis not present

## 2015-06-17 DIAGNOSIS — H5462 Unqualified visual loss, left eye, normal vision right eye: Secondary | ICD-10-CM | POA: Diagnosis not present

## 2015-06-17 DIAGNOSIS — I69392 Facial weakness following cerebral infarction: Secondary | ICD-10-CM | POA: Diagnosis not present

## 2015-06-17 DIAGNOSIS — G301 Alzheimer's disease with late onset: Secondary | ICD-10-CM | POA: Diagnosis not present

## 2015-06-17 DIAGNOSIS — Z96643 Presence of artificial hip joint, bilateral: Secondary | ICD-10-CM | POA: Diagnosis not present

## 2015-06-17 DIAGNOSIS — I69398 Other sequelae of cerebral infarction: Secondary | ICD-10-CM | POA: Diagnosis not present

## 2015-06-18 ENCOUNTER — Telehealth (HOSPITAL_COMMUNITY): Payer: Self-pay

## 2015-06-18 NOTE — Telephone Encounter (Signed)
Post ED Visit - Positive Culture Follow-up  Culture report reviewed by antimicrobial stewardship pharmacist: []  Wes Monmouth, Pharm.D., BCPS []  Heide Guile, Pharm.D., BCPS []  Alycia Rossetti, Pharm.D., BCPS [x]  [place], Pharm.D., BCPS, AAHIVP []  Legrand Como, Pharm.D., BCPS, AAHIVP []  Isac Sarna, Pharm.D., BCPS  Positive Urine culture, >/= 100,000 colonies -> E Coli Treated with Ciprofloxacin, organism sensitive to the same and no further patient follow-up is required at this time.  Dortha Kern 06/18/2015, 5:00 AM

## 2015-06-19 DIAGNOSIS — I6931 Cognitive deficits following cerebral infarction: Secondary | ICD-10-CM | POA: Diagnosis not present

## 2015-06-19 DIAGNOSIS — F0280 Dementia in other diseases classified elsewhere without behavioral disturbance: Secondary | ICD-10-CM | POA: Diagnosis not present

## 2015-06-19 DIAGNOSIS — N183 Chronic kidney disease, stage 3 (moderate): Secondary | ICD-10-CM | POA: Diagnosis not present

## 2015-06-19 DIAGNOSIS — G301 Alzheimer's disease with late onset: Secondary | ICD-10-CM | POA: Diagnosis not present

## 2015-06-19 DIAGNOSIS — I4891 Unspecified atrial fibrillation: Secondary | ICD-10-CM | POA: Diagnosis not present

## 2015-06-19 DIAGNOSIS — I129 Hypertensive chronic kidney disease with stage 1 through stage 4 chronic kidney disease, or unspecified chronic kidney disease: Secondary | ICD-10-CM | POA: Diagnosis not present

## 2015-06-21 DIAGNOSIS — I6931 Cognitive deficits following cerebral infarction: Secondary | ICD-10-CM | POA: Diagnosis not present

## 2015-06-21 DIAGNOSIS — F0280 Dementia in other diseases classified elsewhere without behavioral disturbance: Secondary | ICD-10-CM | POA: Diagnosis not present

## 2015-06-21 DIAGNOSIS — G301 Alzheimer's disease with late onset: Secondary | ICD-10-CM | POA: Diagnosis not present

## 2015-06-21 DIAGNOSIS — N183 Chronic kidney disease, stage 3 (moderate): Secondary | ICD-10-CM | POA: Diagnosis not present

## 2015-06-21 DIAGNOSIS — I4891 Unspecified atrial fibrillation: Secondary | ICD-10-CM | POA: Diagnosis not present

## 2015-06-21 DIAGNOSIS — I129 Hypertensive chronic kidney disease with stage 1 through stage 4 chronic kidney disease, or unspecified chronic kidney disease: Secondary | ICD-10-CM | POA: Diagnosis not present

## 2015-06-25 DIAGNOSIS — G301 Alzheimer's disease with late onset: Secondary | ICD-10-CM | POA: Diagnosis not present

## 2015-06-25 DIAGNOSIS — I4891 Unspecified atrial fibrillation: Secondary | ICD-10-CM | POA: Diagnosis not present

## 2015-06-25 DIAGNOSIS — I6931 Cognitive deficits following cerebral infarction: Secondary | ICD-10-CM | POA: Diagnosis not present

## 2015-06-25 DIAGNOSIS — N183 Chronic kidney disease, stage 3 (moderate): Secondary | ICD-10-CM | POA: Diagnosis not present

## 2015-06-25 DIAGNOSIS — F0280 Dementia in other diseases classified elsewhere without behavioral disturbance: Secondary | ICD-10-CM | POA: Diagnosis not present

## 2015-06-25 DIAGNOSIS — I129 Hypertensive chronic kidney disease with stage 1 through stage 4 chronic kidney disease, or unspecified chronic kidney disease: Secondary | ICD-10-CM | POA: Diagnosis not present

## 2015-06-29 DIAGNOSIS — N183 Chronic kidney disease, stage 3 (moderate): Secondary | ICD-10-CM | POA: Diagnosis not present

## 2015-06-29 DIAGNOSIS — I4891 Unspecified atrial fibrillation: Secondary | ICD-10-CM | POA: Diagnosis not present

## 2015-06-29 DIAGNOSIS — F0280 Dementia in other diseases classified elsewhere without behavioral disturbance: Secondary | ICD-10-CM | POA: Diagnosis not present

## 2015-06-29 DIAGNOSIS — G301 Alzheimer's disease with late onset: Secondary | ICD-10-CM | POA: Diagnosis not present

## 2015-06-29 DIAGNOSIS — I129 Hypertensive chronic kidney disease with stage 1 through stage 4 chronic kidney disease, or unspecified chronic kidney disease: Secondary | ICD-10-CM | POA: Diagnosis not present

## 2015-06-29 DIAGNOSIS — I6931 Cognitive deficits following cerebral infarction: Secondary | ICD-10-CM | POA: Diagnosis not present

## 2015-07-03 DIAGNOSIS — I129 Hypertensive chronic kidney disease with stage 1 through stage 4 chronic kidney disease, or unspecified chronic kidney disease: Secondary | ICD-10-CM | POA: Diagnosis not present

## 2015-07-03 DIAGNOSIS — N183 Chronic kidney disease, stage 3 (moderate): Secondary | ICD-10-CM | POA: Diagnosis not present

## 2015-07-03 DIAGNOSIS — F0280 Dementia in other diseases classified elsewhere without behavioral disturbance: Secondary | ICD-10-CM | POA: Diagnosis not present

## 2015-07-03 DIAGNOSIS — I6931 Cognitive deficits following cerebral infarction: Secondary | ICD-10-CM | POA: Diagnosis not present

## 2015-07-03 DIAGNOSIS — G301 Alzheimer's disease with late onset: Secondary | ICD-10-CM | POA: Diagnosis not present

## 2015-07-03 DIAGNOSIS — I4891 Unspecified atrial fibrillation: Secondary | ICD-10-CM | POA: Diagnosis not present

## 2015-07-19 DIAGNOSIS — N183 Chronic kidney disease, stage 3 (moderate): Secondary | ICD-10-CM | POA: Diagnosis not present

## 2015-07-19 DIAGNOSIS — G301 Alzheimer's disease with late onset: Secondary | ICD-10-CM | POA: Diagnosis not present

## 2015-07-19 DIAGNOSIS — I129 Hypertensive chronic kidney disease with stage 1 through stage 4 chronic kidney disease, or unspecified chronic kidney disease: Secondary | ICD-10-CM | POA: Diagnosis not present

## 2015-07-19 DIAGNOSIS — F0280 Dementia in other diseases classified elsewhere without behavioral disturbance: Secondary | ICD-10-CM | POA: Diagnosis not present

## 2015-07-19 DIAGNOSIS — I4891 Unspecified atrial fibrillation: Secondary | ICD-10-CM | POA: Diagnosis not present

## 2015-07-19 DIAGNOSIS — I6931 Cognitive deficits following cerebral infarction: Secondary | ICD-10-CM | POA: Diagnosis not present

## 2015-08-15 ENCOUNTER — Ambulatory Visit
Admission: RE | Admit: 2015-08-15 | Discharge: 2015-08-15 | Disposition: A | Discharge status: Home / Self Care | Payer: Medicare Other | Source: Ambulatory Visit

## 2015-08-15 DIAGNOSIS — Z1231 Encounter for screening mammogram for malignant neoplasm of breast: Secondary | ICD-10-CM

## 2015-09-05 DIAGNOSIS — Z23 Encounter for immunization: Secondary | ICD-10-CM | POA: Diagnosis not present

## 2015-10-02 DIAGNOSIS — D509 Iron deficiency anemia, unspecified: Secondary | ICD-10-CM | POA: Diagnosis not present

## 2015-10-02 DIAGNOSIS — K5900 Constipation, unspecified: Secondary | ICD-10-CM | POA: Diagnosis not present

## 2015-10-02 DIAGNOSIS — R1310 Dysphagia, unspecified: Secondary | ICD-10-CM | POA: Diagnosis not present

## 2015-10-02 DIAGNOSIS — K219 Gastro-esophageal reflux disease without esophagitis: Secondary | ICD-10-CM | POA: Diagnosis not present

## 2015-10-18 DIAGNOSIS — M545 Low back pain: Secondary | ICD-10-CM | POA: Diagnosis not present

## 2015-10-18 DIAGNOSIS — M6281 Muscle weakness (generalized): Secondary | ICD-10-CM | POA: Diagnosis not present

## 2015-10-18 DIAGNOSIS — Z9181 History of falling: Secondary | ICD-10-CM | POA: Diagnosis not present

## 2015-10-18 DIAGNOSIS — R262 Difficulty in walking, not elsewhere classified: Secondary | ICD-10-CM | POA: Diagnosis not present

## 2015-10-19 DIAGNOSIS — Z9181 History of falling: Secondary | ICD-10-CM | POA: Diagnosis not present

## 2015-10-19 DIAGNOSIS — M545 Low back pain: Secondary | ICD-10-CM | POA: Diagnosis not present

## 2015-10-19 DIAGNOSIS — M6281 Muscle weakness (generalized): Secondary | ICD-10-CM | POA: Diagnosis not present

## 2015-10-19 DIAGNOSIS — R262 Difficulty in walking, not elsewhere classified: Secondary | ICD-10-CM | POA: Diagnosis not present

## 2015-10-22 DIAGNOSIS — M545 Low back pain: Secondary | ICD-10-CM | POA: Diagnosis not present

## 2015-10-22 DIAGNOSIS — R262 Difficulty in walking, not elsewhere classified: Secondary | ICD-10-CM | POA: Diagnosis not present

## 2015-10-22 DIAGNOSIS — M6281 Muscle weakness (generalized): Secondary | ICD-10-CM | POA: Diagnosis not present

## 2015-10-22 DIAGNOSIS — Z9181 History of falling: Secondary | ICD-10-CM | POA: Diagnosis not present

## 2015-10-23 DIAGNOSIS — M545 Low back pain: Secondary | ICD-10-CM | POA: Diagnosis not present

## 2015-10-23 DIAGNOSIS — R262 Difficulty in walking, not elsewhere classified: Secondary | ICD-10-CM | POA: Diagnosis not present

## 2015-10-23 DIAGNOSIS — M6281 Muscle weakness (generalized): Secondary | ICD-10-CM | POA: Diagnosis not present

## 2015-10-23 DIAGNOSIS — Z9181 History of falling: Secondary | ICD-10-CM | POA: Diagnosis not present

## 2015-10-30 DIAGNOSIS — M6281 Muscle weakness (generalized): Secondary | ICD-10-CM | POA: Diagnosis not present

## 2015-10-30 DIAGNOSIS — M545 Low back pain: Secondary | ICD-10-CM | POA: Diagnosis not present

## 2015-10-30 DIAGNOSIS — R262 Difficulty in walking, not elsewhere classified: Secondary | ICD-10-CM | POA: Diagnosis not present

## 2015-10-30 DIAGNOSIS — Z9181 History of falling: Secondary | ICD-10-CM | POA: Diagnosis not present

## 2015-10-31 DIAGNOSIS — M545 Low back pain: Secondary | ICD-10-CM | POA: Diagnosis not present

## 2015-10-31 DIAGNOSIS — Z9181 History of falling: Secondary | ICD-10-CM | POA: Diagnosis not present

## 2015-10-31 DIAGNOSIS — R262 Difficulty in walking, not elsewhere classified: Secondary | ICD-10-CM | POA: Diagnosis not present

## 2015-10-31 DIAGNOSIS — M6281 Muscle weakness (generalized): Secondary | ICD-10-CM | POA: Diagnosis not present

## 2015-11-02 DIAGNOSIS — M545 Low back pain: Secondary | ICD-10-CM | POA: Diagnosis not present

## 2015-11-02 DIAGNOSIS — M6281 Muscle weakness (generalized): Secondary | ICD-10-CM | POA: Diagnosis not present

## 2015-11-02 DIAGNOSIS — R262 Difficulty in walking, not elsewhere classified: Secondary | ICD-10-CM | POA: Diagnosis not present

## 2015-11-02 DIAGNOSIS — Z9181 History of falling: Secondary | ICD-10-CM | POA: Diagnosis not present

## 2015-12-12 DIAGNOSIS — D7589 Other specified diseases of blood and blood-forming organs: Secondary | ICD-10-CM | POA: Diagnosis not present

## 2015-12-12 DIAGNOSIS — E039 Hypothyroidism, unspecified: Secondary | ICD-10-CM | POA: Diagnosis not present

## 2015-12-12 DIAGNOSIS — N183 Chronic kidney disease, stage 3 (moderate): Secondary | ICD-10-CM | POA: Diagnosis not present

## 2015-12-12 DIAGNOSIS — Z79899 Other long term (current) drug therapy: Secondary | ICD-10-CM | POA: Diagnosis not present

## 2015-12-12 DIAGNOSIS — I481 Persistent atrial fibrillation: Secondary | ICD-10-CM | POA: Diagnosis not present

## 2015-12-12 DIAGNOSIS — I129 Hypertensive chronic kidney disease with stage 1 through stage 4 chronic kidney disease, or unspecified chronic kidney disease: Secondary | ICD-10-CM | POA: Diagnosis not present

## 2015-12-12 DIAGNOSIS — R413 Other amnesia: Secondary | ICD-10-CM | POA: Diagnosis not present

## 2015-12-19 ENCOUNTER — Ambulatory Visit (INDEPENDENT_AMBULATORY_CARE_PROVIDER_SITE_OTHER): Payer: Medicare Other | Admitting: Cardiology

## 2015-12-19 ENCOUNTER — Encounter: Payer: Self-pay | Admitting: Cardiology

## 2015-12-19 VITALS — BP 122/92 | HR 128 | Ht 62.0 in | Wt 117.0 lb

## 2015-12-19 DIAGNOSIS — I481 Persistent atrial fibrillation: Secondary | ICD-10-CM | POA: Diagnosis not present

## 2015-12-19 DIAGNOSIS — I4819 Other persistent atrial fibrillation: Secondary | ICD-10-CM

## 2015-12-19 MED ORDER — DILTIAZEM HCL 90 MG PO TABS: 90.0000 mg | ORAL_TABLET | Freq: Three times a day (TID) | ORAL | Status: DC

## 2015-12-19 MED ORDER — RIVAROXABAN 15 MG PO TABS: 15.0000 mg | ORAL_TABLET | Freq: Every day | ORAL | Status: DC

## 2015-12-19 NOTE — Progress Notes (Signed)
[place]. [address] [place], [place]  [address] Phone: [phone] Fax:  [phone]  Date:  12/19/2015   ID:  Beth Johnson, DOB 09/28/1941, MRN [id number]  PCP:  Sandi Mariscal, MD   History of Present Illness: Beth Johnson is a 75 y.o. female status post CVA at rehabilitation facility here for follow up of atrial flutter.  After recent telephone conversation on 06/11/15, daughter spoke with primary physician and increased diltiazem to 60 mg 3 times a day secondary to problems with blood pressure, for instance was 143/111. Had some chest discomfort and headache as well. She is currently residing in [facility]. Currently on Xarelto 15 mg a day.  Review of Beth Johnson note and this is a portion of it: "Pt was found on Thanksgiving 2014 in her home confused and with slurred speech, left facial draw and left sided weakness after she did not respond to calls all day. She was admitted as [facility] in [place] that evening for an acute stroke. Assessment concluded it had likely occured in the prior 24-48 hours based on her CT findings and exam. She was also found to be in new onset a.fib.  She had LUE weakness, facial draw, confusion and some recall issues and left peripheral visual field deficits. She made tremendous gains while in the hospital, and at this point has regained all of her sight and most of her LUE strength. Still some ongoing issues with memory and recall.  However, according to her daughter there were already some preceding memory concerns prior to the CVA that had raised the question of an early dementia, and she would like to evaluate this further while her mom is here in [place]. Her mother has been reluctant to discuss or acknowledge this but has admitted to writing notes to herself to remember items and some forgetfulness when driving in unfamiliar places. Her daughter privately shared that she has noted noticeable forgetfulness in  conversation, repetitious conversations, a flattening of affect and at times a sense that her mom is ignoring or cannot attend to a conversation. To this point there have been no major incidents or safety issues till the stroke. She did not/could not answer her phone all day on Thanksgiving when she was home alone till her sister finally reached her that night and called police to the home along with EMS. Daughter states that her driving is restricted to a small repetitive course of familiar places in her hometown. Pt did have some sundowning in the hospital. 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  Her daughter would like to proceed with initiating evals with neurology for CVA follow up and memory issues , as well as a cardiologist. Pt is on Xarelto starting today- 15mg .  Pt has had a full therapy assessment at [facility] on which she performed quite well. They don't expect her to need to be there more than a week or so. She had a Berg balance scale and got 55/56. Main PT/OT deficits identified were some balance issues. Has almost full function of and strength in her LUE. There have been no swallowing i or choking ssues. She does have some partial aphasia and word findings issues at times and has been more forgetful , flat and confused at times  since the stroke. Her daughter is also concerned she has been depressed as well before the stroke and mentions she and another family member have done well on lexapro.   They hope to transition [name] to their home here in [place] while she is unable to drive after SNF discharge and get home therapy is appropriate with the ultimate goal of restoring her level of functioning such that she can be cleared to drive and return home.She is not a vegetarian. She eats  chicken and fish, PB and lunch meat. Diet is fairly repetitious but pretty healthy. No prior hx of anemia.  Atrial Fibrillation F/U:  c/o Palpitations no. She has picked up her scrip for Xarelto 15 mg today. She is not on any rate controlling medications. She has no other complaints and her daughter has not observed any breathlessness, DOE, chest pain, dizziness or syncope. Pt denies all of the above as well. She did not have any known cardiac hx prior to the stroke that her daughter is aware of."  In May 2014 PCP saw her and dx of AFIB was made according to daughter. Recent ECHO Normal EF. HR mildly elevated in hospital. She currently is asymptomatic in regards to her atrial fibrillation. She does not realize that she is in atrial fibrillation. According to notes, there was a prior history of atrial septal defect that was not noted/seen on her bubble study.   In other words I assume her bubble study did not demonstrate shunt. Injection of contrast documented no intra-atrial shunt. Mild MR/mild TR. There were no significant narrowing of the neck arteries. Stroke occurred in subacute cortical infarction of right frontal convexity. Chest x-ray was unremarkable. Her daughter present for discussion is very integral in her care. Normally her mother resides in [facility] now.  It has been about 2 years since I saw her last.   She went to the emergency room because of headache, atypical chest pain, shortness in the epigastric region. In the past she has had pain in this region and gallbladder was removed. They were concerned because blood pressure was elevated and T waves were inverted on EKG. Dr. Felipa Eth saw her in the office setting.  Since ER visit, her diltiazem was increased to 60 mg 3 times a day. She is doing well with this. She is not having any further chest discomfort and she is quite active at her assisted living facility in [facility].  12/19/15-heart rate at today's visit is 127 bpm with 2-1  atrial flutter conduction. We are increasing her diltiazem to 90 mg 3 times a day. She is asymptomatic with this. She is not feeling short of breath, no syncope, no chest pain. Occasionally she will get sensation in the middle of her stomach at last 4 days duration. She takes Prilosec.   Wt Readings from Last 3 Encounters:  12/19/15 117 lb (53.071 kg)  06/15/15 122 lb (55.339 kg)  06/13/15 124 lb (56.246 kg)     Past Medical History  Diagnosis Date  . CVA (cerebral infarction)     new onset A. fib with CVA 09/2013: LUE weakness, left visual field deficit, facial draw, altered cognition  . Atrial fibrillation ([place])   . Depression   . Hypothyroidism   . Dysrhythmia   . Stroke ([place]) 09/29/13    affected memory and comprehension  . GERD (gastroesophageal reflux disease)   . Hypertension     Past Surgical History  Procedure Laterality Date  . Abdominal hysterectomy    .  Bil hip replacement ( 2 on ? left)    . Appendectomy    . Joint replacement Bilateral     Bilateral hips  . Colonoscopy    . Upper gi endoscopy    . Cholecystectomy N/A 02/06/2014    Procedure: LAPAROSCOPIC CHOLECYSTECTOMY WITH INTRAOPERATIVE CHOLANGIOGRAM;  Surgeon: Imogene Burn. Georgette Dover, MD;  Location: [place];  Service: General;  Laterality: N/A;  . Radiology with anesthesia N/A 06/29/2014    Procedure: RADIOLOGY WITH ANESTHESIA - MRI;  Surgeon: Medication Radiologist, MD;  Location: [facility];  Service: Radiology;  Laterality: N/A;    Current Outpatient Prescriptions  Medication Sig Dispense Refill  . BIOTIN PO Take 1 tablet by mouth daily.    . cholestyramine (QUESTRAN) 4 GM/DOSE powder Take 4 g by mouth daily.    . clobetasol (TEMOVATE) 0.05 % external solution Apply 1 application topically 2 (two) times daily as needed (dry scalp).    Marland Kitchen diltiazem (CARDIZEM) 90 MG tablet Take 1 tablet (90 mg total) by mouth 3 (three) times daily. 270 tablet 3  . escitalopram (LEXAPRO) 20 MG tablet Take 20 mg by mouth daily.    .  ferrous sulfate 325 (65 FE) MG tablet Take 325 mg by mouth 2 (two) times daily with a meal.    . ipratropium (ATROVENT) 0.06 % nasal spray Place 2 sprays into both nostrils 2 (two) times daily as needed (post nasal drip).    Marland Kitchen levothyroxine (SYNTHROID, LEVOTHROID) 50 MCG tablet Take 50 mcg by mouth at bedtime.     Marland Kitchen loperamide (HM LOPERAMIDE HCL) 2 MG capsule Take 2 mg by mouth at bedtime.     . memantine (NAMENDA) 10 MG tablet Take 10 mg by mouth at bedtime.    . metroNIDAZOLE (METROGEL) 0.75 % gel Apply 1 application topically 2 (two) times daily as needed (rosacia).    . Rivaroxaban (XARELTO) 15 MG TABS tablet Take 1 tablet (15 mg total) by mouth daily at 12 noon. 90 tablet 3   No current facility-administered medications for this visit.    Allergies:    Allergies  Allergen Reactions  . Amoxicillin Other (See Comments)    unknown  . Latex Itching and Rash  . Penicillins Other (See Comments)    unknown  . Sulfa Antibiotics Other (See Comments)    unknown    Social History:  The patient  reports that she has never smoked. She does not have any smokeless tobacco history on file. She reports that she drinks about 1.2 oz of alcohol per week. She reports that she does not use illicit drugs.   Family History  Problem Relation Age of Onset  . Heart attack Father   . CAD Father   . Gallstones Mother   . Cancer Mother     ROS:  Please see the history of present illness.   Positive for stroke, no bleeding, no syncope, no chest pain, no shortness of breath   All other systems reviewed and negative.   PHYSICAL EXAM: VS:  BP 122/92 mmHg  Pulse 128  Ht 5\' 2"  (1.575 m)  Wt 117 lb (53.071 kg)  BMI 21.39 kg/m2 Well nourished, well developed, in no acute distress HEENT: normal, [place]/AT, EOMI Neck: no JVD, normal carotid upstroke, no bruit Cardiac: Tachycardic, regular, rate 70 no murmur Lungs:  clear to auscultation bilaterally, no wheezing, rhonchi or rales Abd: soft, nontender, no  hepatomegaly, no bruits Ext: no edema, 2+ distal pulses Skin: warm and dry GU: deferred Neuro: no focal  abnormalities noted, AAO x 3  EKG:  An EKG was ordered today. 12/19/15-2 21 atrial flutter conduction, nonspecific ST-T wave changes. On rhythm strip, there is one instance where 3 atrial beats occur. Prior EKG personally reviewed-Atrial fibrillation/flutter heart rate 90, no other abnormalities  ASSESSMENT AND PLAN:  1. Essential hypertension-doing well. Watch with increasing diltiazem. 2. Atrial fibrillation/atrial flutter-likely permanent, has been in since 5/14, asymptomatic. We will continue with rate control strategy. Long acting diltiazem did not seem to be as effective for her. She is currently taking diltiazem 60 mg 3 times a day. I will increase this to 90 mg 3 times a day. Dr. Felipa Eth. Her heart rate previously was in the 70s. She is doing well.. We had lengthy discussion about rate versus rhythm control. Given her persistent nature of atrial fibrillation/flutter and asymptomatic response , we will continue with rate control. I explained that it is likely that either cardioversion would be unsuccessful or shortly after cardioversion, atrial fibrillation may return. Also regardless of ability to take her out of atrial fibrillation, she still needs anticoagulation. 3. Chronic anticoagulation-continue with Xarelto.  Currently at 15 mg because of chronic kidney disease. We will continue to monitor. Recent creatinine has been 1.57. 4. Atypical chest pain-possibly GERD-like/musculoskeletal. Last 4 days duration. Previously no stress test. 5. History of stroke - this occurred over Thanksgiving, unexpected, likely secondary to atrial fibrillation. They did not wish to take statin therapy. We discussed the pros and cons. Given the likely embolic source and normal LDL, no statin seems like a reasonable approach. 6. We will see back in 2 months.   Signed, Candee Furbish, MD [facility]  12/19/2015 11:35  AM

## 2015-12-19 NOTE — Patient Instructions (Signed)
Medication Instructions:  Please increase your Diltiazem to 90 mg three times a day. Continue all other medications as listed.  Follow-Up: Follow up in 2 months with Dr Marlou Porch.  If you need a refill on your cardiac medications before your next appointment, please call your pharmacy.  Thank you for choosing [facility]!!

## 2016-01-07 DIAGNOSIS — H2513 Age-related nuclear cataract, bilateral: Secondary | ICD-10-CM | POA: Diagnosis not present

## 2016-01-07 DIAGNOSIS — H524 Presbyopia: Secondary | ICD-10-CM | POA: Diagnosis not present

## 2016-01-07 DIAGNOSIS — H40023 Open angle with borderline findings, high risk, bilateral: Secondary | ICD-10-CM | POA: Diagnosis not present

## 2016-01-30 ENCOUNTER — Encounter: Payer: Self-pay | Admitting: Podiatry

## 2016-01-31 ENCOUNTER — Encounter: Payer: Self-pay | Admitting: Podiatry

## 2016-01-31 ENCOUNTER — Ambulatory Visit (INDEPENDENT_AMBULATORY_CARE_PROVIDER_SITE_OTHER): Payer: Medicare Other | Admitting: Podiatry

## 2016-01-31 VITALS — BP 125/77 | HR 78 | Resp 12

## 2016-01-31 DIAGNOSIS — M21619 Bunion of unspecified foot: Secondary | ICD-10-CM

## 2016-01-31 DIAGNOSIS — L84 Corns and callosities: Secondary | ICD-10-CM | POA: Diagnosis not present

## 2016-01-31 DIAGNOSIS — M2010 Hallux valgus (acquired), unspecified foot: Secondary | ICD-10-CM | POA: Diagnosis not present

## 2016-01-31 NOTE — Progress Notes (Signed)
   Subjective:    Patient ID: Beth Johnson, female    DOB: 04/19/1941, 75 y.o.   MRN: [id number]  HPI this patient presents to the office with chief complaint of a painful corn on the fourth toe, right foot. She says that the corn has been present and painful for the last few weeks. She has provided no self treatment nor sought any professional help. No history of trauma or injury to the foot. Patient presents the office for evaluation and treatment of her painful corn.    Review of Systems  Psychiatric/Behavioral:       Memory loss       Objective:   Physical Exam GENERAL APPEARANCE: Alert, conversant. Appropriately groomed. No acute distress.  VASCULAR: Pedal pulses are  palpable at  [facility] and PT bilateral.  Capillary refill time is immediate to all digits,  Normal temperature gradient.  Digital hair growth is present bilateral  NEUROLOGIC: sensation is normal to 5.07 monofilament at 5/5 sites bilateral.  Light touch is intact bilateral, Muscle strength normal.  MUSCULOSKELETAL: acceptable muscle strength, tone and stability bilateral.  Intrinsic muscluature intact bilateral.  Rectus appearance of foot and digits noted bilateral.  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  Tailors bunion 5th MPJ B/L.  Hammer toes 4,5 right foot.  DERMATOLOGIC: skin color, texture, and turgor are within normal limits.  No preulcerative lesions or ulcers  are seen, no interdigital maceration noted.  No open lesions present.  Digital nails are asymptomatic. No drainage noted. Corn noted lateral aspect 4th PIPJ right foot.  Pinch callus right hallux.         Assessment & Plan:  HAV  B/L  Tailors Bunion B/L  Hammer toes 4,5 right   IE  Debridement of corn 4th toe right foot.  Dispense digital cap. RTC prn.   Gardiner Barefoot DPM

## 2016-02-01 DIAGNOSIS — F0280 Dementia in other diseases classified elsewhere without behavioral disturbance: Secondary | ICD-10-CM | POA: Diagnosis not present

## 2016-02-01 DIAGNOSIS — G309 Alzheimer's disease, unspecified: Secondary | ICD-10-CM | POA: Diagnosis not present

## 2016-02-01 DIAGNOSIS — K219 Gastro-esophageal reflux disease without esophagitis: Secondary | ICD-10-CM | POA: Diagnosis not present

## 2016-02-01 DIAGNOSIS — I4891 Unspecified atrial fibrillation: Secondary | ICD-10-CM | POA: Diagnosis not present

## 2016-02-01 DIAGNOSIS — I10 Essential (primary) hypertension: Secondary | ICD-10-CM | POA: Diagnosis not present

## 2016-02-01 DIAGNOSIS — Z79899 Other long term (current) drug therapy: Secondary | ICD-10-CM | POA: Diagnosis not present

## 2016-02-01 DIAGNOSIS — G308 Other Alzheimer's disease: Secondary | ICD-10-CM | POA: Diagnosis not present

## 2016-02-01 DIAGNOSIS — Z9889 Other specified postprocedural states: Secondary | ICD-10-CM | POA: Diagnosis not present

## 2016-02-01 DIAGNOSIS — F0281 Dementia in other diseases classified elsewhere with behavioral disturbance: Secondary | ICD-10-CM | POA: Diagnosis not present

## 2016-02-01 DIAGNOSIS — R42 Dizziness and giddiness: Secondary | ICD-10-CM | POA: Diagnosis not present

## 2016-02-01 DIAGNOSIS — R0789 Other chest pain: Secondary | ICD-10-CM | POA: Diagnosis not present

## 2016-02-01 DIAGNOSIS — E871 Hypo-osmolality and hyponatremia: Secondary | ICD-10-CM | POA: Diagnosis not present

## 2016-02-01 DIAGNOSIS — R404 Transient alteration of awareness: Secondary | ICD-10-CM | POA: Diagnosis not present

## 2016-02-01 DIAGNOSIS — R079 Chest pain, unspecified: Secondary | ICD-10-CM | POA: Diagnosis not present

## 2016-02-01 DIAGNOSIS — I4892 Unspecified atrial flutter: Secondary | ICD-10-CM | POA: Diagnosis not present

## 2016-02-01 DIAGNOSIS — N289 Disorder of kidney and ureter, unspecified: Secondary | ICD-10-CM | POA: Diagnosis not present

## 2016-02-01 DIAGNOSIS — F329 Major depressive disorder, single episode, unspecified: Secondary | ICD-10-CM | POA: Diagnosis not present

## 2016-02-01 DIAGNOSIS — I4430 Unspecified atrioventricular block: Secondary | ICD-10-CM | POA: Diagnosis not present

## 2016-02-01 DIAGNOSIS — Z90710 Acquired absence of both cervix and uterus: Secondary | ICD-10-CM | POA: Diagnosis not present

## 2016-02-01 DIAGNOSIS — Z8673 Personal history of transient ischemic attack (TIA), and cerebral infarction without residual deficits: Secondary | ICD-10-CM | POA: Diagnosis not present

## 2016-02-02 DIAGNOSIS — R079 Chest pain, unspecified: Secondary | ICD-10-CM | POA: Diagnosis not present

## 2016-02-02 DIAGNOSIS — I4892 Unspecified atrial flutter: Secondary | ICD-10-CM | POA: Diagnosis not present

## 2016-02-02 DIAGNOSIS — G309 Alzheimer's disease, unspecified: Secondary | ICD-10-CM | POA: Diagnosis not present

## 2016-02-02 DIAGNOSIS — I10 Essential (primary) hypertension: Secondary | ICD-10-CM | POA: Diagnosis not present

## 2016-02-02 DIAGNOSIS — K219 Gastro-esophageal reflux disease without esophagitis: Secondary | ICD-10-CM | POA: Diagnosis not present

## 2016-02-02 DIAGNOSIS — F0280 Dementia in other diseases classified elsewhere without behavioral disturbance: Secondary | ICD-10-CM | POA: Diagnosis not present

## 2016-02-04 ENCOUNTER — Encounter: Payer: Self-pay | Admitting: Cardiology

## 2016-02-04 ENCOUNTER — Ambulatory Visit (INDEPENDENT_AMBULATORY_CARE_PROVIDER_SITE_OTHER): Payer: Medicare Other | Admitting: Cardiology

## 2016-02-04 VITALS — BP 110/74 | HR 80 | Ht 62.0 in | Wt 118.4 lb

## 2016-02-04 DIAGNOSIS — I10 Essential (primary) hypertension: Secondary | ICD-10-CM

## 2016-02-04 DIAGNOSIS — R0789 Other chest pain: Secondary | ICD-10-CM

## 2016-02-04 DIAGNOSIS — Z8673 Personal history of transient ischemic attack (TIA), and cerebral infarction without residual deficits: Secondary | ICD-10-CM | POA: Diagnosis not present

## 2016-02-04 DIAGNOSIS — I4892 Unspecified atrial flutter: Secondary | ICD-10-CM | POA: Diagnosis not present

## 2016-02-04 DIAGNOSIS — I4819 Other persistent atrial fibrillation: Secondary | ICD-10-CM

## 2016-02-04 DIAGNOSIS — I481 Persistent atrial fibrillation: Secondary | ICD-10-CM | POA: Diagnosis not present

## 2016-02-04 NOTE — Patient Instructions (Signed)
Medication Instructions:  The current medical regimen is effective;  continue present plan and medications.  Testing/Procedures: Your physician has requested that you have a lexiscan myoview. For further information please visit [email or web address]. Please follow instruction sheet, as given.  Follow-Up: Follow up in 1 year with Dr. Skains.  You will receive a letter in the mail 2 months before you are due.  Please call us when you receive this letter to schedule your follow up appointment.  If you need a refill on your cardiac medications before your next appointment, please call your pharmacy.  Thank you for choosing [facility]!!     

## 2016-02-04 NOTE — Progress Notes (Signed)
[facility]. [address], [address] [place], [place]  [address] Phone: [phone] Fax:  [phone]  Date:  02/04/2016   ID:  Beth Johnson, DOB 08/14/1941, MRN [id number]  PCP:  Mathews Argyle, MD   History of Present Illness: Beth Johnson is a 75 y.o. female status post CVA at rehabilitation facility here for follow up of atrial flutter.  After recent telephone conversation on 06/11/15, daughter spoke with primary physician and increased diltiazem to 60 mg 3 times a day secondary to problems with blood pressure, for instance was 143/111. Had some chest discomfort and headache as well. She is currently residing in [facility]. Currently on Xarelto 15 mg a day because of renal function.  Review of [name] note and this is a portion of it:  "Pt was found on Thanksgiving 2014 in her home confused and with slurred speech, left facial draw and left sided weakness after she did not respond to calls all day. She was admitted as [facility] in [place] that evening for an acute stroke. Assessment concluded it had likely occured in the prior 24-48 hours based on her CT findings and exam. She was also found to be in new onset a.fib.  She had LUE weakness, facial draw, confusion and some recall issues and left peripheral visual field deficits. She made tremendous gains while in the hospital, and at this point has regained all of her sight and most of her LUE strength. Still some ongoing issues with memory and recall.  However, according to her daughter there were already some preceding memory concerns prior to the CVA that had raised the question of an early dementia, and she would like to evaluate this further while her mom is here in [place]. Her mother has been reluctant to discuss or acknowledge this but has admitted to writing notes to herself to remember items and some forgetfulness when driving in unfamiliar places. Her daughter privately shared that she has noted noticeable  forgetfulness in conversation, repetitious conversations, a flattening of affect and at times a sense that her mom is ignoring or cannot attend to a conversation. To this point there have been no major incidents or safety issues till the stroke. She did not/could not answer her phone all day on Thanksgiving when she was home alone till her sister finally reached her that night and called police to the home along with EMS. Daughter states that her driving is restricted to a small repetitive course of familiar places in her hometown. Pt did have some sundowning in the hospital. 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  Her daughter would like to proceed with initiating evals with neurology for CVA follow up and memory issues , as well as a cardiologist. Pt is on Xarelto starting today- 15mg .  Pt has had a full therapy assessment at [facility] on which she performed quite well. They don't expect her to need to be there more than a week or so. She had a Berg balance scale and got 55/56. Main PT/OT deficits identified were some balance issues. Has almost full function of and strength in her LUE. There have been no swallowing  or choking ssues. She does have some partial aphasia and word findings issues at times and has been more forgetful , flat  and confused at times since the stroke. Her daughter is also concerned she has been depressed as well before the stroke and mentions she and another family member have done well on lexapro.   They hope to transition Ms. Tellefsen to their home here in [place] while she is unable to drive after SNF discharge and get home therapy is appropriate with the ultimate goal of restoring her level of functioning such that she can be cleared to drive and return home.She is not a  vegetarian. She eats chicken and fish, PB and lunch meat. Diet is fairly repetitious but pretty healthy. No prior hx of anemia.  Atrial Fibrillation F/U:  c/o Palpitations no. She has picked up her scrip for Xarelto 15 mg today. She is not on any rate controlling medications. She has no other complaints and her daughter has not observed any breathlessness, DOE, chest pain, dizziness or syncope. Pt denies all of the above as well. She did not have any known cardiac hx prior to the stroke that her daughter is aware of."  In May 2014 PCP saw her and dx of AFIB was made according to daughter. Recent ECHO Normal EF. HR mildly elevated in hospital. She currently is asymptomatic in regards to her atrial fibrillation. She does not realize that she is in atrial fibrillation. According to notes, there was a prior history of atrial septal defect that was not noted/seen on her bubble study.   In other words I assume her bubble study did not demonstrate shunt. Injection of contrast documented no intra-atrial shunt. Mild MR/mild TR. There were no significant narrowing of the neck arteries. Stroke occurred in subacute cortical infarction of right frontal convexity. Chest x-ray was unremarkable. Her daughter present for discussion is very integral in her care. Normally her mother resides in [facility] now.  She went to the emergency room because of headache, atypical chest pain, shortness in the epigastric region. In the past she has had pain in this region and gallbladder was removed. They were concerned because blood pressure was elevated and T waves were inverted on EKG. Dr. Felipa Eth saw her in the office setting.  Since ER visit, her diltiazem was increased to 60 mg 3 times a day. She is doing well with this. She is not having any further chest discomfort and she is quite active at her assisted living facility in [facility].  12/19/15-heart rate at today's visit is 127 bpm with 2-1 atrial flutter conduction. We are  increasing her diltiazem to 90 mg 3 times a day. She is asymptomatic with this. She is not feeling short of breath, no syncope, no chest pain. Occasionally she will get sensation in the middle of her stomach at last 4 days duration. She takes Prilosec.   02/04/16- still having some recent chest pain at sternum, questionable dizziness. Lunch time Friday. Pain in sturnum. ?GERD. Wanted to call ambulance. Wanted to take to [facility] reg. Went to ER. Trop was normal. Wanted to keep her overnight.  Currently chest pain-free. Records were reviewed on care everywhere.  Been to GI and endo was done. PPI. Her husband having CABG, aortic valve.    Wt Readings from Last 3 Encounters:  02/04/16 118 lb 6 oz (53.695 kg)  12/19/15 117 lb (53.071 kg)  06/15/15 122 lb (55.339 kg)     Past Medical History  Diagnosis Date  . CVA (cerebral infarction)     new onset A. fib with CVA 09/2013: LUE weakness, left visual field deficit,  facial draw, altered cognition  . Atrial fibrillation ([place])   . Depression   . Hypothyroidism   . Dysrhythmia   . Stroke ([place]) 09/29/13    affected memory and comprehension  . GERD (gastroesophageal reflux disease)   . Hypertension     Past Surgical History  Procedure Laterality Date  . Abdominal hysterectomy    . Bil hip replacement ( 2 on ? left)    . Appendectomy    . Joint replacement Bilateral     Bilateral hips  . Colonoscopy    . Upper gi endoscopy    . Cholecystectomy N/A 02/06/2014    Procedure: LAPAROSCOPIC CHOLECYSTECTOMY WITH INTRAOPERATIVE CHOLANGIOGRAM;  Surgeon: Imogene Burn. Georgette Dover, MD;  Location: [facility];  Service: General;  Laterality: N/A;  . Radiology with anesthesia N/A 06/29/2014    Procedure: RADIOLOGY WITH ANESTHESIA - MRI;  Surgeon: Medication Radiologist, MD;  Location: [facility];  Service: Radiology;  Laterality: N/A;    Current Outpatient Prescriptions  Medication Sig Dispense Refill  . BIOTIN PO Take 1 tablet by mouth daily.    . cholestyramine  (QUESTRAN) 4 GM/DOSE powder Take 4 g by mouth daily.    . clobetasol (TEMOVATE) 0.05 % external solution Apply 1 application topically 2 (two) times daily as needed (dry scalp).    Marland Kitchen diltiazem (CARDIZEM) 90 MG tablet Take 1 tablet (90 mg total) by mouth 3 (three) times daily. 270 tablet 3  . escitalopram (LEXAPRO) 20 MG tablet Take 20 mg by mouth daily.    . ferrous sulfate 325 (65 FE) MG tablet Take 325 mg by mouth 2 (two) times daily with a meal.    . ipratropium (ATROVENT) 0.06 % nasal spray Place 2 sprays into both nostrils 2 (two) times daily as needed (post nasal drip).    Marland Kitchen levothyroxine (SYNTHROID, LEVOTHROID) 50 MCG tablet Take 50 mcg by mouth at bedtime.     Marland Kitchen loperamide (HM LOPERAMIDE HCL) 2 MG capsule Take 2 mg by mouth at bedtime.     . Memantine HCl-Donepezil HCl (NAMZARIC) 14-10 MG CP24 Take 1 tablet by mouth daily.    . metroNIDAZOLE (METROGEL) 0.75 % gel Apply 1 application topically 2 (two) times daily as needed (rosacia).    Marland Kitchen omeprazole (PRILOSEC) 40 MG capsule Take 40 mg by mouth daily.    . Rivaroxaban (XARELTO) 15 MG TABS tablet Take 1 tablet (15 mg total) by mouth daily at 12 noon. 90 tablet 3   No current facility-administered medications for this visit.    Allergies:    Allergies  Allergen Reactions  . Adhesive [Tape]   . Amoxicillin Other (See Comments)    unknown  . Latex Itching and Rash  . Penicillins Other (See Comments)    unknown  . Sulfa Antibiotics Other (See Comments)    unknown    Social History:  The patient  reports that she has never smoked. She does not have any smokeless tobacco history on file. She reports that she drinks about 1.2 oz of alcohol per week. She reports that she does not use illicit drugs.   Family History  Problem Relation Age of Onset  . Heart attack Father   . CAD Father   . Gallstones Mother   . Cancer Mother     ROS:  Please see the history of present illness.   Positive for stroke, no bleeding, no syncope, no chest  pain, no shortness of breath   All other systems reviewed and negative.   PHYSICAL EXAM:  VS:  BP 110/74 mmHg  Pulse 80  Ht 5\' 2"  (1.575 m)  Wt 118 lb 6 oz (53.695 kg)  BMI 21.65 kg/m2 Well nourished, well developed, in no acute distress HEENT: normal, [place]/AT, EOMI Neck: no JVD, normal carotid upstroke, no bruit Cardiac: Tachycardic, regular, rate 70 no murmur Lungs:  clear to auscultation bilaterally, no wheezing, rhonchi or rales Abd: soft, nontender, no hepatomegaly, no bruits Ext: no edema, 2+ distal pulses Skin: warm and dry GU: deferred Neuro: no focal abnormalities noted,  Alert, possible mild confusion  EKG:   12/19/15-2 21 atrial flutter conduction, nonspecific ST-T wave changes. On rhythm strip, there is one instance where 3 atrial beats occur. Prior EKG personally reviewed-Atrial fibrillation/flutter heart rate 90, no other abnormalities  ASSESSMENT AND PLAN:  1. Essential hypertension-doing well. Watch with increasing diltiazem. 2. Atrial fibrillation/atrial flutter- permanent, has been in since 5/14, asymptomatic. We will continue with rate control strategy. Long acting diltiazem did not seem to be as effective for her. She is currently taking diltiazem 90 mg 3 times a day. Dr. Felipa Eth. Her heart rate previously was in the 70s. She is doing well.. We had  Previous lengthy discussion about rate versus rhythm control. Given her persistent nature of atrial fibrillation/flutter and asymptomatic response , we will continue with rate control. I explained that it is likely that either cardioversion would be unsuccessful or shortly after cardioversion, atrial fibrillation may return. Also regardless of ability to take her out of atrial fibrillation, she still needs anticoagulation. 3. Chronic anticoagulation-continue with Xarelto.  Currently at 15 mg because of chronic kidney disease. We will continue to monitor. Recent creatinine has been 1.57. 4. Atypical chest pain-possibly  GERD-like/musculoskeletal.   GI has evaluated, endoscopies have been performed,  Proton pump inhibitor given. She still had another episode of chest pain that resulted [facility]. Her daughter is accompanying her today and requesting further evaluation with stress testing. This is reasonable. Previously no stress test. troponins were normal. Her daughter was not happy that she had to stay overnight. 5. History of stroke - this occurred over Thanksgiving, unexpected, likely secondary to atrial fibrillation. They did not wish to take statin therapy. We discussed the pros and cons. Given the likely embolic source and normal LDL, no statin seems like a reasonable approach. 6. We will see back in 6 months.   Signed, Candee Furbish, MD [facility]  02/04/2016 9:24 AM

## 2016-02-05 ENCOUNTER — Telehealth (HOSPITAL_COMMUNITY): Payer: Self-pay | Admitting: *Deleted

## 2016-02-05 NOTE — Telephone Encounter (Signed)
Patients daughter given detailed instructions per Myocardial Perfusion Study Information Sheet for the test on 02/08/16 at 1130. Patient notified to arrive 15 minutes early and that it is imperative to arrive on time for appointment to keep from having the test rescheduled.  If you need to cancel or reschedule your appointment, please call the office within 24 hours of your appointment. Failure to do so may result in a cancellation of your appointment, and a $50 no show fee. Patient's daughter verbalized understanding.Beth Johnson, Ranae Palms

## 2016-02-08 ENCOUNTER — Ambulatory Visit (HOSPITAL_COMMUNITY): Payer: Medicare Other | Attending: Internal Medicine

## 2016-02-08 DIAGNOSIS — R0789 Other chest pain: Secondary | ICD-10-CM | POA: Insufficient documentation

## 2016-02-08 DIAGNOSIS — I10 Essential (primary) hypertension: Secondary | ICD-10-CM | POA: Diagnosis not present

## 2016-02-08 LAB — MYOCARDIAL PERFUSION IMAGING
CHL CUP NUCLEAR SDS: 5
CHL CUP NUCLEAR SRS: 4
CHL CUP NUCLEAR SSS: 9
CHL CUP RESTING HR STRESS: 67 {beats}/min
LV sys vol: 24 mL
LVDIAVOL: 67 mL (ref 46–106)
Peak HR: 98 {beats}/min
RATE: 0.25
TID: 0.86

## 2016-02-08 MED ORDER — REGADENOSON 0.4 MG/5ML IV SOLN
0.4000 mg | Freq: Once | INTRAVENOUS | Status: AC
  Administered 2016-02-08: 0.4 mg via INTRAVENOUS

## 2016-02-08 MED ORDER — TECHNETIUM TC 99M SESTAMIBI GENERIC - CARDIOLITE
11.0000 | Freq: Once | INTRAVENOUS | Status: AC | PRN
  Administered 2016-02-08: 11 via INTRAVENOUS

## 2016-02-08 MED ORDER — TECHNETIUM TC 99M SESTAMIBI GENERIC - CARDIOLITE
32.2000 | Freq: Once | INTRAVENOUS | Status: AC | PRN
  Administered 2016-02-08: 32.2 via INTRAVENOUS

## 2016-02-20 ENCOUNTER — Ambulatory Visit: Payer: Medicare Other | Admitting: Cardiology

## 2016-03-04 DIAGNOSIS — I129 Hypertensive chronic kidney disease with stage 1 through stage 4 chronic kidney disease, or unspecified chronic kidney disease: Secondary | ICD-10-CM | POA: Diagnosis not present

## 2016-03-04 DIAGNOSIS — R413 Other amnesia: Secondary | ICD-10-CM | POA: Diagnosis not present

## 2016-03-04 DIAGNOSIS — I482 Chronic atrial fibrillation: Secondary | ICD-10-CM | POA: Diagnosis not present

## 2016-03-04 DIAGNOSIS — N183 Chronic kidney disease, stage 3 (moderate): Secondary | ICD-10-CM | POA: Diagnosis not present

## 2016-03-05 ENCOUNTER — Ambulatory Visit: Payer: Medicare Other | Admitting: Cardiology

## 2016-04-17 ENCOUNTER — Ambulatory Visit (INDEPENDENT_AMBULATORY_CARE_PROVIDER_SITE_OTHER): Payer: Medicare Other | Admitting: Podiatry

## 2016-04-17 DIAGNOSIS — L84 Corns and callosities: Secondary | ICD-10-CM | POA: Diagnosis not present

## 2016-04-17 DIAGNOSIS — M2041 Other hammer toe(s) (acquired), right foot: Secondary | ICD-10-CM | POA: Diagnosis not present

## 2016-04-17 NOTE — Progress Notes (Signed)
Subjective:     Patient ID: Beth Johnson, female   DOB: 1941/01/31, 75 y.o.   MRN: [id number]  HPI this patient presents to the office with chief complaint of a painful fourth toe, right foot. This is the second visit for a painful corn on the outside of the fourth toe, right foot. She has applied acid to the site of the corn in an effort to relieve the pain. She says that she is now experiencing throbbing pain noted to the fourth toe of the right foot. She returns to the office today for treatment by request definitive treatment for the elimination of the corn itself   Review of Systems     Objective:   Physical Exam Physical Exam GENERAL APPEARANCE: Alert, conversant. Appropriately groomed. No acute distress.  VASCULAR: Pedal pulses are palpable at [facility] and PT bilateral. Capillary refill time is immediate to all digits, Normal temperature gradient. Digital hair growth is present bilateral  NEUROLOGIC: sensation is normal to 5.07 monofilament at 5/5 sites bilateral. Light touch is intact bilateral, Muscle strength normal.  MUSCULOSKELETAL: acceptable muscle strength, tone and stability bilateral. Intrinsic muscluature intact bilateral. Rectus appearance of foot and digits noted bilateral. 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 Tailors bunion 5th MPJ B/L. Hammer toes 4,5 right foot.  DERMATOLOGIC: skin color, texture, and turgor are within normal limits. No preulcerative lesions or ulcers are seen, no interdigital maceration noted. No open lesions present. Digital nails are asymptomatic. No drainage noted. Corn noted lateral aspect 4th PIPJ right foot. Pinch callus right hallux.     Assessment:     Hammer toe fourth right  Infected skin /corn fourth toe right foot.     Plan:     ROV  Debride necrotic tissue right foot..  Told her to discontinue treatment.  Recommend she make an appointment with Dr. Paulla Dolly for surgical consultation.    Gardiner Barefoot DPM

## 2016-04-18 ENCOUNTER — Encounter: Payer: Self-pay | Admitting: Podiatry

## 2016-04-22 ENCOUNTER — Telehealth: Payer: Self-pay | Admitting: *Deleted

## 2016-04-22 NOTE — Telephone Encounter (Signed)
"  My mother was scheduled an appointment with another doctor to schedule surgery.  I don't have any information about what would be done.  How long it will take for her to recover?  Will she be in a boot?  I'm her power of attorney.  My husband came in with her last visit.  She saw Dr. Prudence Davidson and he referred her to Dr. Paulla Dolly."  He has diagnosed her of having Hammer Toes 3,4 right foot.  It will take about 4-6 weeks to recover.  She will be wearing a surgical shoe.  "I want to cancel that appointment.  My mother has Alzheimers and she can't endure that right now.  We'll see if maybe we can get a different treatment somewhere else."  We can still see her and treat it conservatively by trimming it.  "That's what I meant."  I'll cancel the consult with Dr. Paulla Dolly.

## 2016-04-25 ENCOUNTER — Ambulatory Visit: Payer: Medicare Other | Admitting: Podiatry

## 2016-06-05 ENCOUNTER — Ambulatory Visit (INDEPENDENT_AMBULATORY_CARE_PROVIDER_SITE_OTHER): Payer: Medicare Other | Admitting: Podiatry

## 2016-06-05 DIAGNOSIS — M2010 Hallux valgus (acquired), unspecified foot: Secondary | ICD-10-CM

## 2016-06-05 DIAGNOSIS — L84 Corns and callosities: Secondary | ICD-10-CM

## 2016-06-05 DIAGNOSIS — M2041 Other hammer toe(s) (acquired), right foot: Secondary | ICD-10-CM

## 2016-06-05 NOTE — Progress Notes (Signed)
Subjective:     Patient ID: Beth Johnson, female   DOB: 1941/02/04, 75 y.o.   MRN: [id number]  HPI this patient presents to the office with chief complaint of a painful fourth toe, right foot. This is the second visit for a painful corn on the outside of the fourth toe, right foot. She has applied acid to the site of the corn in an effort to relieve the pain. She says that she is now experiencing throbbing pain noted to the fourth toe of the right foot. She returns to the office today for treatment by request definitive treatment for the elimination of the corn itself   Review of Systems     Objective:   Physical Exam Physical Exam GENERAL APPEARANCE: Alert, conversant. Appropriately groomed. No acute distress.  VASCULAR: Pedal pulses are palpable at [facility] and PT bilateral. Capillary refill time is immediate to all digits, Normal temperature gradient. Digital hair growth is present bilateral  NEUROLOGIC: sensation is normal to 5.07 monofilament at 5/5 sites bilateral. Light touch is intact bilateral, Muscle strength normal.  MUSCULOSKELETAL: acceptable muscle strength, tone and stability bilateral. Intrinsic muscluature intact bilateral. Rectus appearance of foot and digits noted bilateral. 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 Tailors bunion 5th MPJ B/L. Hammer toes 4,5 right foot.  DERMATOLOGIC: skin color, texture, and turgor are within normal limits. No preulcerative lesions or ulcers are seen, no interdigital maceration noted. No open lesions present. Digital nails are asymptomatic. No drainage noted. Corn noted lateral aspect 4th PIPJ right foot. Pinch callus right hallux.Corn 1/2 right foot.  Corn fifth toe right foot.     Assessment:     Hammer toe fourth right  Infected skin /corn fourth toe right foot.  Corn on fifth toe right foot medial to medial nail border. Corn 1/2 right foot.    Plan:     ROV  Debride necrotic tissue right foot..  Told her to discontinue treatment.   Her family believes she is incapable undergoing surgery.  Therefore we will provide preventive foot care services as needed.   Gardiner Barefoot DPM

## 2016-06-06 ENCOUNTER — Other Ambulatory Visit: Payer: Self-pay | Admitting: Geriatric Medicine

## 2016-06-06 DIAGNOSIS — Z1231 Encounter for screening mammogram for malignant neoplasm of breast: Secondary | ICD-10-CM

## 2016-07-17 ENCOUNTER — Ambulatory Visit (INDEPENDENT_AMBULATORY_CARE_PROVIDER_SITE_OTHER): Payer: Medicare Other | Admitting: Podiatry

## 2016-07-17 ENCOUNTER — Encounter: Payer: Self-pay | Admitting: Podiatry

## 2016-07-17 VITALS — BP 124/48 | HR 70 | Resp 14

## 2016-07-17 DIAGNOSIS — M2041 Other hammer toe(s) (acquired), right foot: Secondary | ICD-10-CM

## 2016-07-17 DIAGNOSIS — L84 Corns and callosities: Secondary | ICD-10-CM | POA: Diagnosis not present

## 2016-07-17 NOTE — Progress Notes (Signed)
Subjective:     Patient ID: Beth Johnson, female   DOB: Mar 01, 1941, 75 y.o.   MRN: [id number]  HPI this patient presents to the office with chief complaint of a painful Fifth toe right foot. She says that there was some so much pain that she was in tears and she was unable to touch the fifth toe of the right foot. She presents the office today stating that she has stopped her blood thinner and is prepared for nail surgery due to an ingrown nail. She has provided no self treatment nor sought any professional help. She presents the office for an evaluation and treatment of this condition   Review of Systems     Objective:   Physical Exam Physical Exam GENERAL APPEARANCE: Alert, conversant. Appropriately groomed. No acute distress.  VASCULAR: Pedal pulses are palpable at [facility] and PT bilateral. Capillary refill time is immediate to all digits, Normal temperature gradient. Digital hair growth is present bilateral  NEUROLOGIC: sensation is normal to 5.07 monofilament at 5/5 sites bilateral. Light touch is intact bilateral, Muscle strength normal.  MUSCULOSKELETAL: acceptable muscle strength, tone and stability bilateral. Intrinsic muscluature intact bilateral. Rectus appearance of foot and digits noted bilateral. 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 Tailors bunion 5th MPJ B/L. Hammer toes 4,5 right foot.  DERMATOLOGIC: skin color, texture, and turgor are within normal limits. No preulcerative lesions or ulcers are seen, no interdigital maceration noted. No open lesions present. Digital nails are asymptomatic. No drainage noted. Corn noted lateral aspect 4th PIPJ right foot. Pinch callus right hallux.Corn 1/2 right foot.  Corn fifth toe right foot medial aspect medial nail fifth toe right.     Assessment:  Hammer toe 4,5 right foot.  Corn fifth toe right foot.    Plan:     ROV  Debride corn medial aspect fifth toe at the level of nail.  RTC prn   Gardiner Barefoot DPM

## 2016-08-19 DIAGNOSIS — I482 Chronic atrial fibrillation: Secondary | ICD-10-CM | POA: Diagnosis not present

## 2016-08-19 DIAGNOSIS — Z79899 Other long term (current) drug therapy: Secondary | ICD-10-CM | POA: Diagnosis not present

## 2016-08-19 DIAGNOSIS — E039 Hypothyroidism, unspecified: Secondary | ICD-10-CM | POA: Diagnosis not present

## 2016-08-19 DIAGNOSIS — Z23 Encounter for immunization: Secondary | ICD-10-CM | POA: Diagnosis not present

## 2016-08-19 DIAGNOSIS — Z1389 Encounter for screening for other disorder: Secondary | ICD-10-CM | POA: Diagnosis not present

## 2016-08-19 DIAGNOSIS — Z0000 Encounter for general adult medical examination without abnormal findings: Secondary | ICD-10-CM | POA: Diagnosis not present

## 2016-08-19 DIAGNOSIS — I129 Hypertensive chronic kidney disease with stage 1 through stage 4 chronic kidney disease, or unspecified chronic kidney disease: Secondary | ICD-10-CM | POA: Diagnosis not present

## 2016-08-19 DIAGNOSIS — R197 Diarrhea, unspecified: Secondary | ICD-10-CM | POA: Diagnosis not present

## 2016-08-19 DIAGNOSIS — R1013 Epigastric pain: Secondary | ICD-10-CM | POA: Diagnosis not present

## 2016-08-19 DIAGNOSIS — N183 Chronic kidney disease, stage 3 (moderate): Secondary | ICD-10-CM | POA: Diagnosis not present

## 2016-08-21 ENCOUNTER — Ambulatory Visit
Admission: RE | Admit: 2016-08-21 | Discharge: 2016-08-21 | Disposition: A | Discharge status: Home / Self Care | Payer: Medicare Other | Source: Ambulatory Visit | Attending: Geriatric Medicine | Admitting: Geriatric Medicine

## 2016-08-21 DIAGNOSIS — Z1231 Encounter for screening mammogram for malignant neoplasm of breast: Secondary | ICD-10-CM

## 2016-09-04 DIAGNOSIS — L814 Other melanin hyperpigmentation: Secondary | ICD-10-CM | POA: Diagnosis not present

## 2016-09-04 DIAGNOSIS — Z85828 Personal history of other malignant neoplasm of skin: Secondary | ICD-10-CM | POA: Diagnosis not present

## 2016-09-04 DIAGNOSIS — L280 Lichen simplex chronicus: Secondary | ICD-10-CM | POA: Diagnosis not present

## 2016-09-04 DIAGNOSIS — D1801 Hemangioma of skin and subcutaneous tissue: Secondary | ICD-10-CM | POA: Diagnosis not present

## 2016-09-04 DIAGNOSIS — L821 Other seborrheic keratosis: Secondary | ICD-10-CM | POA: Diagnosis not present

## 2016-09-04 DIAGNOSIS — L579 Skin changes due to chronic exposure to nonionizing radiation, unspecified: Secondary | ICD-10-CM | POA: Diagnosis not present

## 2016-09-24 ENCOUNTER — Encounter: Payer: Self-pay | Admitting: Podiatry

## 2016-09-24 ENCOUNTER — Ambulatory Visit (INDEPENDENT_AMBULATORY_CARE_PROVIDER_SITE_OTHER): Payer: Medicare Other | Admitting: Podiatry

## 2016-09-24 VITALS — Ht 62.0 in | Wt 118.0 lb

## 2016-09-24 DIAGNOSIS — L84 Corns and callosities: Secondary | ICD-10-CM

## 2016-09-24 NOTE — Progress Notes (Signed)
Subjective:     Patient ID: Beth Johnson, female   DOB: 06/20/1941, 75 y.o.   MRN: [id number]  HPI this patient presents to the office with chief complaint of a painful Fifth toe right foot. She says There is much less pain on the fifth toe of the right foot at this visit. She says she can touch her toe as opposed to her previous visit. She presents having corn on the fifth toe as well as the second toe of the right foot. She presents the office today for an evaluation and treatment of this condition   Review of Systems     Objective:   Physical Exam Physical Exam GENERAL APPEARANCE: Alert, conversant. Appropriately groomed. No acute distress.  VASCULAR: Pedal pulses are palpable at [facility] and PT bilateral. Capillary refill time is immediate to all digits, Normal temperature gradient. Digital hair growth is present bilateral  NEUROLOGIC: sensation is normal to 5.07 monofilament at 5/5 sites bilateral. Light touch is intact bilateral, Muscle strength normal.  MUSCULOSKELETAL: acceptable muscle strength, tone and stability bilateral. Intrinsic muscluature intact bilateral. Rectus appearance of foot and digits noted bilateral. 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 Tailors bunion 5th MPJ B/L. Hammer toes 4,5 right foot.  DERMATOLOGIC: skin color, texture, and turgor are within normal limits. No preulcerative lesions or ulcers are seen, no interdigital maceration noted. No open lesions present. Digital nails are asymptomatic. No drainage noted. Corn noted lateral aspect 4th PIPJ right foot. Pinch callus right hallux.Corn 1/2 right foot.  Corn fifth toe right foot medial aspect medial nail fifth toe right.     Assessment:  Hammer toe 4,5 right foot.  Corn fifth toe right foot.    Plan:     ROV  Debride corn medial aspect fifth toe at the level of nail.  RTC prn.  Padding was dispensed.   Gardiner Barefoot DPM

## 2016-09-29 DIAGNOSIS — R197 Diarrhea, unspecified: Secondary | ICD-10-CM | POA: Diagnosis not present

## 2016-10-30 DIAGNOSIS — M6281 Muscle weakness (generalized): Secondary | ICD-10-CM | POA: Diagnosis not present

## 2016-10-30 DIAGNOSIS — M542 Cervicalgia: Secondary | ICD-10-CM | POA: Diagnosis not present

## 2016-10-31 DIAGNOSIS — M542 Cervicalgia: Secondary | ICD-10-CM | POA: Diagnosis not present

## 2016-10-31 DIAGNOSIS — M6281 Muscle weakness (generalized): Secondary | ICD-10-CM | POA: Diagnosis not present

## 2016-11-04 DIAGNOSIS — M6281 Muscle weakness (generalized): Secondary | ICD-10-CM | POA: Diagnosis not present

## 2016-11-04 DIAGNOSIS — M542 Cervicalgia: Secondary | ICD-10-CM | POA: Diagnosis not present

## 2016-11-05 DIAGNOSIS — M542 Cervicalgia: Secondary | ICD-10-CM | POA: Diagnosis not present

## 2016-11-05 DIAGNOSIS — M6281 Muscle weakness (generalized): Secondary | ICD-10-CM | POA: Diagnosis not present

## 2016-11-11 DIAGNOSIS — M6281 Muscle weakness (generalized): Secondary | ICD-10-CM | POA: Diagnosis not present

## 2016-11-11 DIAGNOSIS — M542 Cervicalgia: Secondary | ICD-10-CM | POA: Diagnosis not present

## 2016-11-13 DIAGNOSIS — H1131 Conjunctival hemorrhage, right eye: Secondary | ICD-10-CM | POA: Diagnosis not present

## 2016-11-13 DIAGNOSIS — I129 Hypertensive chronic kidney disease with stage 1 through stage 4 chronic kidney disease, or unspecified chronic kidney disease: Secondary | ICD-10-CM | POA: Diagnosis not present

## 2016-11-13 DIAGNOSIS — R197 Diarrhea, unspecified: Secondary | ICD-10-CM | POA: Diagnosis not present

## 2016-11-13 DIAGNOSIS — N183 Chronic kidney disease, stage 3 (moderate): Secondary | ICD-10-CM | POA: Diagnosis not present

## 2016-11-13 DIAGNOSIS — G301 Alzheimer's disease with late onset: Secondary | ICD-10-CM | POA: Diagnosis not present

## 2016-11-13 DIAGNOSIS — I482 Chronic atrial fibrillation: Secondary | ICD-10-CM | POA: Diagnosis not present

## 2016-11-13 DIAGNOSIS — F0280 Dementia in other diseases classified elsewhere without behavioral disturbance: Secondary | ICD-10-CM | POA: Diagnosis not present

## 2016-11-21 DIAGNOSIS — M6281 Muscle weakness (generalized): Secondary | ICD-10-CM | POA: Diagnosis not present

## 2016-11-21 DIAGNOSIS — M542 Cervicalgia: Secondary | ICD-10-CM | POA: Diagnosis not present

## 2016-11-26 DIAGNOSIS — M542 Cervicalgia: Secondary | ICD-10-CM | POA: Diagnosis not present

## 2016-11-26 DIAGNOSIS — M6281 Muscle weakness (generalized): Secondary | ICD-10-CM | POA: Diagnosis not present

## 2016-11-27 DIAGNOSIS — M542 Cervicalgia: Secondary | ICD-10-CM | POA: Diagnosis not present

## 2016-11-27 DIAGNOSIS — M6281 Muscle weakness (generalized): Secondary | ICD-10-CM | POA: Diagnosis not present

## 2016-12-02 DIAGNOSIS — M542 Cervicalgia: Secondary | ICD-10-CM | POA: Diagnosis not present

## 2016-12-02 DIAGNOSIS — M6281 Muscle weakness (generalized): Secondary | ICD-10-CM | POA: Diagnosis not present

## 2016-12-04 DIAGNOSIS — M542 Cervicalgia: Secondary | ICD-10-CM | POA: Diagnosis not present

## 2016-12-04 DIAGNOSIS — M6281 Muscle weakness (generalized): Secondary | ICD-10-CM | POA: Diagnosis not present

## 2016-12-16 DIAGNOSIS — M542 Cervicalgia: Secondary | ICD-10-CM | POA: Diagnosis not present

## 2016-12-16 DIAGNOSIS — M6281 Muscle weakness (generalized): Secondary | ICD-10-CM | POA: Diagnosis not present

## 2017-01-12 ENCOUNTER — Other Ambulatory Visit: Payer: Self-pay | Admitting: Cardiology

## 2017-01-13 NOTE — Telephone Encounter (Signed)
Returned call to pt's daughter Carlos Levering, she states pt's primary MD Dr Felipa Eth rx Eliquis for pt instead of Xarelto secondary to cost.  She is unsure of dosage of Eliquis pt is taking at present, but will bring medications to upcoming OV with Dr Marlou Porch in a couple of weeks to update medications list.  Will deny refill request for Xarelto per pt's daughter pt is not taking, she has switched to Eliquis per primary care MD Dr Felipa Eth.

## 2017-01-13 NOTE — Telephone Encounter (Signed)
Patients daughter Gus Puma called and stated that she was returning a call from the office in regards to her mothers medication. There is no note so I am unsure of who called her and she states that the caller did not leave a name. Per daughter the patient does not need the xarelto refilled the patient has switched to eliquis due to cost. Florentina Jenny can be reached at [phone]. Thanks, MI

## 2017-01-28 ENCOUNTER — Encounter: Payer: Self-pay | Admitting: Cardiology

## 2017-02-06 DIAGNOSIS — I482 Chronic atrial fibrillation: Secondary | ICD-10-CM | POA: Diagnosis not present

## 2017-02-06 DIAGNOSIS — F0280 Dementia in other diseases classified elsewhere without behavioral disturbance: Secondary | ICD-10-CM | POA: Diagnosis not present

## 2017-02-06 DIAGNOSIS — E039 Hypothyroidism, unspecified: Secondary | ICD-10-CM | POA: Diagnosis not present

## 2017-02-06 DIAGNOSIS — H1131 Conjunctival hemorrhage, right eye: Secondary | ICD-10-CM | POA: Diagnosis not present

## 2017-02-06 DIAGNOSIS — N189 Chronic kidney disease, unspecified: Secondary | ICD-10-CM | POA: Diagnosis not present

## 2017-02-06 DIAGNOSIS — R531 Weakness: Secondary | ICD-10-CM | POA: Diagnosis not present

## 2017-02-06 DIAGNOSIS — I69398 Other sequelae of cerebral infarction: Secondary | ICD-10-CM | POA: Diagnosis not present

## 2017-02-06 DIAGNOSIS — I129 Hypertensive chronic kidney disease with stage 1 through stage 4 chronic kidney disease, or unspecified chronic kidney disease: Secondary | ICD-10-CM | POA: Diagnosis not present

## 2017-02-06 DIAGNOSIS — H918X2 Other specified hearing loss, left ear: Secondary | ICD-10-CM | POA: Diagnosis not present

## 2017-02-06 DIAGNOSIS — H409 Unspecified glaucoma: Secondary | ICD-10-CM | POA: Diagnosis not present

## 2017-02-06 DIAGNOSIS — R197 Diarrhea, unspecified: Secondary | ICD-10-CM | POA: Diagnosis not present

## 2017-02-06 DIAGNOSIS — G301 Alzheimer's disease with late onset: Secondary | ICD-10-CM | POA: Diagnosis not present

## 2017-02-09 ENCOUNTER — Ambulatory Visit (INDEPENDENT_AMBULATORY_CARE_PROVIDER_SITE_OTHER): Payer: Medicare Other | Admitting: Cardiology

## 2017-02-09 ENCOUNTER — Encounter: Payer: Self-pay | Admitting: Cardiology

## 2017-02-09 VITALS — BP 110/60 | HR 85 | Ht 62.0 in | Wt 115.2 lb

## 2017-02-09 DIAGNOSIS — Z8673 Personal history of transient ischemic attack (TIA), and cerebral infarction without residual deficits: Secondary | ICD-10-CM | POA: Diagnosis not present

## 2017-02-09 DIAGNOSIS — R0789 Other chest pain: Secondary | ICD-10-CM

## 2017-02-09 DIAGNOSIS — I10 Essential (primary) hypertension: Secondary | ICD-10-CM | POA: Diagnosis not present

## 2017-02-09 DIAGNOSIS — I4892 Unspecified atrial flutter: Secondary | ICD-10-CM

## 2017-02-09 NOTE — Progress Notes (Signed)
[place]. [address], [place]  [address] Phone: [phone] Fax:  [phone]  Date:  02/09/2017   ID:  Maida Widger, DOB [birth year], MRN [id number]  PCP:  Mathews Argyle, MD   History of Present Illness: Beth Johnson is a 76 y.o. female status post CVA at rehabilitation facility here for follow up of atrial flutter. Please see bottom of note for new findings.  Prior telephone conversation on 06/11/15, daughter spoke with primary physician and increased diltiazem to 60 mg 3 times a day secondary to problems with blood pressure, for instance was 143/111. Had some chest discomfort and headache as well. She is currently residing in [facility]. Currently on Xarelto 15 mg a day because of renal function.  Review of [name] note and this is a portion of it:  "Pt was found on Thanksgiving 2014 in her home confused and with slurred speech, left facial draw and left sided weakness after she did not respond to calls all day. She was admitted as [facility] in [place] that evening for an acute stroke. Assessment concluded it had likely occured in the prior 24-48 hours based on her CT findings and exam. She was also found to be in new onset a.fib.  She had LUE weakness, facial draw, confusion and some recall issues and left peripheral visual field deficits. She made tremendous gains while in the hospital, and at this point has regained all of her sight and most of her LUE strength. Still some ongoing issues with memory and recall.  However, according to her daughter there were already some preceding memory concerns prior to the CVA that had raised the question of an early dementia, and she would like to evaluate this further while her mom is here in [place]. Her mother has been reluctant to discuss or acknowledge this but has admitted to writing notes to herself to remember items and some forgetfulness when driving in unfamiliar places. Her daughter privately  shared that she has noted noticeable forgetfulness in conversation, repetitious conversations, a flattening of affect and at times a sense that her mom is ignoring or cannot attend to a conversation. To this point there have been no major incidents or safety issues till the stroke. She did not/could not answer her phone all day on Thanksgiving when she was home alone till her sister finally reached her that night and called police to the home along with EMS. Daughter states that her driving is restricted to a small repetitive course of familiar places in her hometown. Pt did have some sundowning in the hospital. 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  Her daughter would like to proceed with initiating evals with neurology for CVA follow up and memory issues , as well as a cardiologist. Pt is on Xarelto starting today- 15mg .  Pt has had a full therapy assessment at [facility] on which she performed quite well. They don't expect her to need to be there more than a week or so. She had a Berg balance scale and got 55/56. Main PT/OT deficits identified were some balance issues. Has almost full function of and strength in her LUE. There have been no swallowing  or choking ssues. She does have some partial aphasia and word findings issues at times  and has been more forgetful , flat and confused at times since the stroke. Her daughter is also concerned she has been depressed as well before the stroke and mentions she and another family member have done well on lexapro.   They hope to transition Ms. Malicki to their home here in [place] while she is unable to drive after SNF discharge and get home therapy is appropriate with the ultimate goal of restoring her level of functioning such that she can be cleared to drive  and return home.She is not a vegetarian. She eats chicken and fish, PB and lunch meat. Diet is fairly repetitious but pretty healthy. No prior hx of anemia.  Atrial Fibrillation F/U:  c/o Palpitations no. She has picked up her scrip for Xarelto 15 mg today. She is not on any rate controlling medications. She has no other complaints and her daughter has not observed any breathlessness, DOE, chest pain, dizziness or syncope. Pt denies all of the above as well. She did not have any known cardiac hx prior to the stroke that her daughter is aware of."  In May 2014 PCP saw her and dx of AFIB was made according to daughter. Recent ECHO Normal EF. HR mildly elevated in hospital. She currently is asymptomatic in regards to her atrial fibrillation. She does not realize that she is in atrial fibrillation. According to notes, there was a prior history of atrial septal defect that was not noted/seen on her bubble study.   In other words I assume her bubble study did not demonstrate shunt. Injection of contrast documented no intra-atrial shunt. Mild MR/mild TR. There were no significant narrowing of the neck arteries. Stroke occurred in subacute cortical infarction of right frontal convexity. Chest x-ray was unremarkable. Her daughter present for discussion is very integral in her care. Normally her mother resides in [facility] now.  She went to the emergency room because of headache, atypical chest pain, shortness in the epigastric region. In the past she has had pain in this region and gallbladder was removed. They were concerned because blood pressure was elevated and T waves were inverted on EKG. Dr. Felipa Eth saw her in the office setting.  Since ER visit, her diltiazem was increased to 60 mg 3 times a day. She is doing well with this. She is not having any further chest discomfort and she is quite active at her assisted living facility in [facility].  12/19/15-heart rate at today's visit is 127 bpm with 2-1  atrial flutter conduction. We are increasing her diltiazem to 90 mg 3 times a day. She is asymptomatic with this. She is not feeling short of breath, no syncope, no chest pain. Occasionally she will get sensation in the middle of her stomach at last 4 days duration. She takes Prilosec.   02/04/16- still having some recent chest pain at sternum, questionable dizziness. Lunch time Friday. Pain in sturnum. ?GERD. Wanted to call ambulance. Wanted to take to [facility] reg. Went to ER. Trop was normal. Wanted to keep her overnight.  Currently chest pain-free. Records were reviewed on care everywhere.  Been to GI and endo was done. PPI. Her husband having CABG, aortic valve.   02/09/17-overall has been doing quite well. Atrial fibrillation is been under good control. No further atypical chest pain episodes. Prior nuclear stress test in April 2017 was low risk. Reassuring.   Wt Readings from Last 3 Encounters:  02/09/17 115 lb 3.2 oz (52.3 kg)  09/24/16 118 lb (53.5 kg)  02/08/16 118 lb (53.5 kg)     Past Medical History:  Diagnosis Date  . Atrial fibrillation ([place])   . CVA (cerebral infarction)    new onset A. fib with CVA 09/2013: LUE weakness, left visual field deficit, facial draw, altered cognition  . Depression   . Dysrhythmia   . GERD (gastroesophageal reflux disease)   . Hypertension   . Hypothyroidism   . Stroke [facility]) 09/29/13   affected memory and comprehension    Past Surgical History:  Procedure Laterality Date  . ABDOMINAL HYSTERECTOMY    . APPENDECTOMY    . bil hip replacement ( 2 on ? left)    . CHOLECYSTECTOMY N/A 02/06/2014   Procedure: LAPAROSCOPIC CHOLECYSTECTOMY WITH INTRAOPERATIVE CHOLANGIOGRAM;  Surgeon: Imogene Burn. Georgette Dover, MD;  Location: [facility];  Service: General;  Laterality: N/A;  . COLONOSCOPY    . JOINT REPLACEMENT Bilateral    Bilateral hips  . RADIOLOGY WITH ANESTHESIA N/A 06/29/2014   Procedure: RADIOLOGY WITH ANESTHESIA - MRI;  Surgeon: Medication Radiologist,  MD;  Location: [facility];  Service: Radiology;  Laterality: N/A;  . UPPER GI ENDOSCOPY      Current Outpatient Prescriptions  Medication Sig Dispense Refill  . cholestyramine (QUESTRAN) 4 GM/DOSE powder Take 4 g by mouth daily.    Marland Kitchen diltiazem (TIAZAC) 240 MG 24 hr capsule Take 240 mg by mouth daily.    Marland Kitchen ELIQUIS 5 MG TABS tablet Take 5 oz by mouth 2 (two) times daily.    Marland Kitchen escitalopram (LEXAPRO) 20 MG tablet Take 20 mg by mouth daily.    . lansoprazole (PREVACID) 15 MG capsule Take 15 mg by mouth daily.    Marland Kitchen levothyroxine (SYNTHROID, LEVOTHROID) 50 MCG tablet Take 50 mcg by mouth at bedtime.     Marland Kitchen loperamide (HM LOPERAMIDE HCL) 2 MG capsule Take 2 mg by mouth at bedtime.     Marland Kitchen NAMZARIC 28-10 MG CP24 Take 1 tablet by mouth daily.    Marland Kitchen omeprazole (PRILOSEC) 40 MG capsule Take 40 mg by mouth daily.     No current facility-administered medications for this visit.     Allergies:    Allergies  Allergen Reactions  . Adhesive [Tape]   . Amoxicillin Other (See Comments)    unknown  . Latex Itching and Rash  . Penicillins Other (See Comments)    unknown  . Sulfa Antibiotics Other (See Comments)    unknown    Social History:  The patient  reports that she has never smoked. She has never used smokeless tobacco. She reports that she drinks about 1.2 oz of alcohol per week . She reports that she does not use drugs.   Family History  Problem Relation Age of Onset  . Heart attack Father   . CAD Father   . Gallstones Mother   . Cancer Mother     ROS:  Please see the history of present illness.   Positive for stroke, no bleeding, no syncope, no chest pain, no shortness of breath   All other systems reviewed and negative.   PHYSICAL EXAM: VS:  BP 110/60 (BP Location: Left Arm, Patient Position: Sitting, Cuff Size: Normal)   Pulse 85   Ht 5\' 2"  (1.575 m)   Wt 115 lb 3.2 oz (52.3 kg)   SpO2 99%   BMI 21.07 kg/m  Well nourished, well developed, in no acute distress  HEENT: normal, [place]/AT,  EOMI Neck: no JVD, normal carotid upstroke, no bruit Cardiac: RRR with occasional ectopy, normal  rate no murmur  Lungs:  Clear to auscultation bilaterally, no wheezing, rhonchi or rales  Abd: soft, nontender, no hepatomegaly, no bruits  Ext: no edema, 2+ distal pulses Skin: warm and dry  GU: deferred Neuro: no focal abnormalities noted, alert. confusion.  EKG:  Interestingly 02/09/17 she is showing sinus rhythm with occasional PACs. No atrial flutter personally viewed-prior 12/19/15-2:1 atrial flutter conduction, nonspecific ST-T wave changes. On rhythm strip, there is one instance where 3 atrial beats occur. Prior EKG personally reviewed-Atrial fibrillation/flutter heart rate 90, no other abnormalities  NUC stress 02/08/16:  Nuclear stress EF: 65%.  There was no ST segment deviation noted during stress.  The study is normal.  The left ventricular ejection fraction is normal (55-65%).   Normal stress nuclear study with no ischemia or infarction; EF 65 with normal wall motion; patient in atrial flutter during the study.  ASSESSMENT AND PLAN:   Paroxysmal atrial fibrillation/flutter  - Previously I thought that this was permanent however today she is showing [place] sinus rhythm with occasional PACs. Very interesting. She has not undergone cardioversion. She has been asymptomatic with her fibrillation.  - Continue with Eliquis 5 g twice a day. Her daughter is concerned that she is missing one of her doses.  - Continue with diltiazem 240 once a day. Blood pressure under excellent control.  Essential hypertension  - Well controlled currently. No changes.  Chronic anticoagulation  - She has changed from Xarelto over to Eliquis. Continue. Currently on 5 mg twice a day. Given her weight loss, would watch her creatinine carefully. If greater than 1.5 on a consistent basis she should be at 2.5 twice a day dosing to reduce bleeding complication.  Atypical chest pain-no further complaints Her  nuclear stress test on 02/08/16 was low risk but no ischemia. Reassuring. Troponins were normal.  History of stroke  -  likely secondary to atrial fibrillation. They did not wish to take statin therapy. We discussed the pros and cons. Given the likely embolic source and normal LDL.  Alzheimer's dementia  - Fairly progressed. Dr. Felipa Eth has been seeing her. Her daughter is concerned that she may have a UTI. She will call Dr. Felipa Eth.  We will see back in 12 months.   Signed, Candee Furbish, MD [facility]  02/09/2017 11:01 AM

## 2017-02-09 NOTE — Patient Instructions (Signed)

## 2017-02-10 DIAGNOSIS — R531 Weakness: Secondary | ICD-10-CM | POA: Diagnosis not present

## 2017-02-10 DIAGNOSIS — R197 Diarrhea, unspecified: Secondary | ICD-10-CM | POA: Diagnosis not present

## 2017-02-12 DIAGNOSIS — N390 Urinary tract infection, site not specified: Secondary | ICD-10-CM | POA: Diagnosis not present

## 2017-02-16 DIAGNOSIS — R197 Diarrhea, unspecified: Secondary | ICD-10-CM | POA: Diagnosis not present

## 2017-02-16 DIAGNOSIS — H40023 Open angle with borderline findings, high risk, bilateral: Secondary | ICD-10-CM | POA: Diagnosis not present

## 2017-02-16 DIAGNOSIS — R531 Weakness: Secondary | ICD-10-CM | POA: Diagnosis not present

## 2017-02-16 DIAGNOSIS — H2513 Age-related nuclear cataract, bilateral: Secondary | ICD-10-CM | POA: Diagnosis not present

## 2017-02-21 DIAGNOSIS — R197 Diarrhea, unspecified: Secondary | ICD-10-CM | POA: Diagnosis not present

## 2017-02-21 DIAGNOSIS — R531 Weakness: Secondary | ICD-10-CM | POA: Diagnosis not present

## 2017-02-23 DIAGNOSIS — R197 Diarrhea, unspecified: Secondary | ICD-10-CM | POA: Diagnosis not present

## 2017-02-23 DIAGNOSIS — R531 Weakness: Secondary | ICD-10-CM | POA: Diagnosis not present

## 2017-02-25 DIAGNOSIS — R197 Diarrhea, unspecified: Secondary | ICD-10-CM | POA: Diagnosis not present

## 2017-02-25 DIAGNOSIS — R531 Weakness: Secondary | ICD-10-CM | POA: Diagnosis not present

## 2017-03-03 ENCOUNTER — Ambulatory Visit
Admission: RE | Admit: 2017-03-03 | Discharge: 2017-03-03 | Disposition: A | Discharge status: Home / Self Care | Payer: Medicare Other | Source: Ambulatory Visit | Attending: Nurse Practitioner | Admitting: Nurse Practitioner

## 2017-03-03 ENCOUNTER — Other Ambulatory Visit: Payer: Self-pay | Admitting: Nurse Practitioner

## 2017-03-03 DIAGNOSIS — R8290 Unspecified abnormal findings in urine: Secondary | ICD-10-CM | POA: Diagnosis not present

## 2017-03-03 DIAGNOSIS — R600 Localized edema: Secondary | ICD-10-CM | POA: Diagnosis not present

## 2017-03-03 DIAGNOSIS — F99 Mental disorder, not otherwise specified: Secondary | ICD-10-CM | POA: Diagnosis not present

## 2017-03-03 DIAGNOSIS — R05 Cough: Secondary | ICD-10-CM | POA: Diagnosis not present

## 2017-03-03 DIAGNOSIS — R197 Diarrhea, unspecified: Secondary | ICD-10-CM | POA: Diagnosis not present

## 2017-03-03 DIAGNOSIS — D649 Anemia, unspecified: Secondary | ICD-10-CM | POA: Diagnosis not present

## 2017-03-03 DIAGNOSIS — R062 Wheezing: Secondary | ICD-10-CM | POA: Diagnosis not present

## 2017-03-03 DIAGNOSIS — I129 Hypertensive chronic kidney disease with stage 1 through stage 4 chronic kidney disease, or unspecified chronic kidney disease: Secondary | ICD-10-CM | POA: Diagnosis not present

## 2017-03-03 DIAGNOSIS — N183 Chronic kidney disease, stage 3 (moderate): Secondary | ICD-10-CM | POA: Diagnosis not present

## 2017-03-03 DIAGNOSIS — J181 Lobar pneumonia, unspecified organism: Secondary | ICD-10-CM | POA: Diagnosis not present

## 2017-03-03 DIAGNOSIS — R531 Weakness: Secondary | ICD-10-CM | POA: Diagnosis not present

## 2017-03-04 DIAGNOSIS — R197 Diarrhea, unspecified: Secondary | ICD-10-CM | POA: Diagnosis not present

## 2017-03-04 DIAGNOSIS — R531 Weakness: Secondary | ICD-10-CM | POA: Diagnosis not present

## 2017-03-10 DIAGNOSIS — I5021 Acute systolic (congestive) heart failure: Secondary | ICD-10-CM | POA: Diagnosis not present

## 2017-03-10 DIAGNOSIS — N183 Chronic kidney disease, stage 3 (moderate): Secondary | ICD-10-CM | POA: Diagnosis not present

## 2017-03-10 DIAGNOSIS — E611 Iron deficiency: Secondary | ICD-10-CM | POA: Diagnosis not present

## 2017-03-10 DIAGNOSIS — Z23 Encounter for immunization: Secondary | ICD-10-CM | POA: Diagnosis not present

## 2017-03-10 DIAGNOSIS — I482 Chronic atrial fibrillation: Secondary | ICD-10-CM | POA: Diagnosis not present

## 2017-03-10 DIAGNOSIS — I129 Hypertensive chronic kidney disease with stage 1 through stage 4 chronic kidney disease, or unspecified chronic kidney disease: Secondary | ICD-10-CM | POA: Diagnosis not present

## 2017-03-10 DIAGNOSIS — G301 Alzheimer's disease with late onset: Secondary | ICD-10-CM | POA: Diagnosis not present

## 2017-03-17 DIAGNOSIS — R197 Diarrhea, unspecified: Secondary | ICD-10-CM | POA: Diagnosis not present

## 2017-03-17 DIAGNOSIS — R531 Weakness: Secondary | ICD-10-CM | POA: Diagnosis not present

## 2017-04-03 DIAGNOSIS — R531 Weakness: Secondary | ICD-10-CM | POA: Diagnosis not present

## 2017-04-03 DIAGNOSIS — R197 Diarrhea, unspecified: Secondary | ICD-10-CM | POA: Diagnosis not present

## 2017-06-11 DIAGNOSIS — L814 Other melanin hyperpigmentation: Secondary | ICD-10-CM | POA: Diagnosis not present

## 2017-06-11 DIAGNOSIS — D225 Melanocytic nevi of trunk: Secondary | ICD-10-CM | POA: Diagnosis not present

## 2017-06-11 DIAGNOSIS — L309 Dermatitis, unspecified: Secondary | ICD-10-CM | POA: Diagnosis not present

## 2017-06-11 DIAGNOSIS — D1801 Hemangioma of skin and subcutaneous tissue: Secondary | ICD-10-CM | POA: Diagnosis not present

## 2017-06-11 DIAGNOSIS — Z85828 Personal history of other malignant neoplasm of skin: Secondary | ICD-10-CM | POA: Diagnosis not present

## 2017-06-23 DIAGNOSIS — Z23 Encounter for immunization: Secondary | ICD-10-CM | POA: Diagnosis not present

## 2017-06-23 DIAGNOSIS — N183 Chronic kidney disease, stage 3 (moderate): Secondary | ICD-10-CM | POA: Diagnosis not present

## 2017-06-23 DIAGNOSIS — I5032 Chronic diastolic (congestive) heart failure: Secondary | ICD-10-CM | POA: Diagnosis not present

## 2017-06-23 DIAGNOSIS — Z1389 Encounter for screening for other disorder: Secondary | ICD-10-CM | POA: Diagnosis not present

## 2017-06-23 DIAGNOSIS — I129 Hypertensive chronic kidney disease with stage 1 through stage 4 chronic kidney disease, or unspecified chronic kidney disease: Secondary | ICD-10-CM | POA: Diagnosis not present

## 2017-06-23 DIAGNOSIS — I482 Chronic atrial fibrillation: Secondary | ICD-10-CM | POA: Diagnosis not present

## 2017-06-23 DIAGNOSIS — D508 Other iron deficiency anemias: Secondary | ICD-10-CM | POA: Diagnosis not present

## 2017-06-23 DIAGNOSIS — G301 Alzheimer's disease with late onset: Secondary | ICD-10-CM | POA: Diagnosis not present

## 2017-09-08 DIAGNOSIS — I129 Hypertensive chronic kidney disease with stage 1 through stage 4 chronic kidney disease, or unspecified chronic kidney disease: Secondary | ICD-10-CM | POA: Diagnosis not present

## 2017-09-08 DIAGNOSIS — I482 Chronic atrial fibrillation: Secondary | ICD-10-CM | POA: Diagnosis not present

## 2017-09-08 DIAGNOSIS — G301 Alzheimer's disease with late onset: Secondary | ICD-10-CM | POA: Diagnosis not present

## 2017-09-08 DIAGNOSIS — Z23 Encounter for immunization: Secondary | ICD-10-CM | POA: Diagnosis not present

## 2017-09-08 DIAGNOSIS — I5032 Chronic diastolic (congestive) heart failure: Secondary | ICD-10-CM | POA: Diagnosis not present

## 2017-09-08 DIAGNOSIS — N184 Chronic kidney disease, stage 4 (severe): Secondary | ICD-10-CM | POA: Diagnosis not present

## 2017-09-23 ENCOUNTER — Other Ambulatory Visit: Payer: Self-pay | Admitting: Geriatric Medicine

## 2017-09-23 DIAGNOSIS — Z1231 Encounter for screening mammogram for malignant neoplasm of breast: Secondary | ICD-10-CM

## 2017-10-29 ENCOUNTER — Ambulatory Visit: Payer: Medicare Other

## 2017-11-17 DIAGNOSIS — N390 Urinary tract infection, site not specified: Secondary | ICD-10-CM | POA: Diagnosis not present

## 2017-11-23 ENCOUNTER — Ambulatory Visit
Admission: RE | Admit: 2017-11-23 | Discharge: 2017-11-23 | Disposition: A | Discharge status: Home / Self Care | Payer: Medicare Other | Source: Ambulatory Visit | Attending: Geriatric Medicine | Admitting: Geriatric Medicine

## 2017-11-23 DIAGNOSIS — Z1231 Encounter for screening mammogram for malignant neoplasm of breast: Secondary | ICD-10-CM | POA: Diagnosis not present

## 2018-01-11 DIAGNOSIS — N184 Chronic kidney disease, stage 4 (severe): Secondary | ICD-10-CM | POA: Diagnosis not present

## 2018-01-11 DIAGNOSIS — Z79899 Other long term (current) drug therapy: Secondary | ICD-10-CM | POA: Diagnosis not present

## 2018-01-11 DIAGNOSIS — G301 Alzheimer's disease with late onset: Secondary | ICD-10-CM | POA: Diagnosis not present

## 2018-01-11 DIAGNOSIS — I129 Hypertensive chronic kidney disease with stage 1 through stage 4 chronic kidney disease, or unspecified chronic kidney disease: Secondary | ICD-10-CM | POA: Diagnosis not present

## 2018-01-11 DIAGNOSIS — I482 Chronic atrial fibrillation: Secondary | ICD-10-CM | POA: Diagnosis not present

## 2018-01-11 DIAGNOSIS — N183 Chronic kidney disease, stage 3 (moderate): Secondary | ICD-10-CM | POA: Diagnosis not present

## 2018-01-11 DIAGNOSIS — I5032 Chronic diastolic (congestive) heart failure: Secondary | ICD-10-CM | POA: Diagnosis not present

## 2018-02-22 DIAGNOSIS — E039 Hypothyroidism, unspecified: Secondary | ICD-10-CM | POA: Diagnosis not present

## 2018-02-22 DIAGNOSIS — I482 Chronic atrial fibrillation: Secondary | ICD-10-CM | POA: Diagnosis not present

## 2018-02-22 DIAGNOSIS — I129 Hypertensive chronic kidney disease with stage 1 through stage 4 chronic kidney disease, or unspecified chronic kidney disease: Secondary | ICD-10-CM | POA: Diagnosis not present

## 2018-02-22 DIAGNOSIS — N183 Chronic kidney disease, stage 3 (moderate): Secondary | ICD-10-CM | POA: Diagnosis not present

## 2018-02-22 DIAGNOSIS — Z79899 Other long term (current) drug therapy: Secondary | ICD-10-CM | POA: Diagnosis not present

## 2018-02-22 DIAGNOSIS — G301 Alzheimer's disease with late onset: Secondary | ICD-10-CM | POA: Diagnosis not present

## 2018-03-13 DIAGNOSIS — S0990XA Unspecified injury of head, initial encounter: Secondary | ICD-10-CM | POA: Diagnosis not present

## 2018-03-13 DIAGNOSIS — S0190XA Unspecified open wound of unspecified part of head, initial encounter: Secondary | ICD-10-CM | POA: Diagnosis not present

## 2018-03-14 DIAGNOSIS — M542 Cervicalgia: Secondary | ICD-10-CM | POA: Diagnosis not present

## 2018-03-14 DIAGNOSIS — Z96641 Presence of right artificial hip joint: Secondary | ICD-10-CM | POA: Diagnosis not present

## 2018-03-14 DIAGNOSIS — S199XXA Unspecified injury of neck, initial encounter: Secondary | ICD-10-CM | POA: Diagnosis not present

## 2018-03-14 DIAGNOSIS — Z96643 Presence of artificial hip joint, bilateral: Secondary | ICD-10-CM | POA: Diagnosis not present

## 2018-03-14 DIAGNOSIS — S0181XA Laceration without foreign body of other part of head, initial encounter: Secondary | ICD-10-CM | POA: Diagnosis not present

## 2018-03-14 DIAGNOSIS — W0110XA Fall on same level from slipping, tripping and stumbling with subsequent striking against unspecified object, initial encounter: Secondary | ICD-10-CM | POA: Diagnosis not present

## 2018-03-14 DIAGNOSIS — G9389 Other specified disorders of brain: Secondary | ICD-10-CM | POA: Diagnosis not present

## 2018-03-14 DIAGNOSIS — S60222A Contusion of left hand, initial encounter: Secondary | ICD-10-CM | POA: Diagnosis not present

## 2018-03-14 DIAGNOSIS — M25551 Pain in right hip: Secondary | ICD-10-CM | POA: Diagnosis not present

## 2018-03-14 DIAGNOSIS — M47812 Spondylosis without myelopathy or radiculopathy, cervical region: Secondary | ICD-10-CM | POA: Diagnosis not present

## 2018-03-14 DIAGNOSIS — Y998 Other external cause status: Secondary | ICD-10-CM | POA: Diagnosis not present

## 2018-03-14 DIAGNOSIS — S0101XA Laceration without foreign body of scalp, initial encounter: Secondary | ICD-10-CM | POA: Diagnosis not present

## 2018-03-14 DIAGNOSIS — S0003XA Contusion of scalp, initial encounter: Secondary | ICD-10-CM | POA: Diagnosis not present

## 2018-03-14 DIAGNOSIS — F0390 Unspecified dementia without behavioral disturbance: Secondary | ICD-10-CM | POA: Diagnosis not present

## 2018-03-14 DIAGNOSIS — Z471 Aftercare following joint replacement surgery: Secondary | ICD-10-CM | POA: Diagnosis not present

## 2018-03-26 DIAGNOSIS — R300 Dysuria: Secondary | ICD-10-CM | POA: Diagnosis not present

## 2018-04-05 ENCOUNTER — Ambulatory Visit (HOSPITAL_BASED_OUTPATIENT_CLINIC_OR_DEPARTMENT_OTHER)
Admission: RE | Admit: 2018-04-05 | Discharge: 2018-04-05 | Disposition: A | Discharge status: Home / Self Care | Payer: Medicare Other | Source: Ambulatory Visit | Attending: Geriatric Medicine | Admitting: Geriatric Medicine

## 2018-04-05 ENCOUNTER — Other Ambulatory Visit (HOSPITAL_BASED_OUTPATIENT_CLINIC_OR_DEPARTMENT_OTHER): Payer: Self-pay | Admitting: Geriatric Medicine

## 2018-04-05 ENCOUNTER — Other Ambulatory Visit (HOSPITAL_COMMUNITY): Payer: Self-pay | Admitting: Geriatric Medicine

## 2018-04-05 DIAGNOSIS — R519 Headache, unspecified: Secondary | ICD-10-CM

## 2018-04-05 DIAGNOSIS — G309 Alzheimer's disease, unspecified: Secondary | ICD-10-CM | POA: Diagnosis not present

## 2018-04-05 DIAGNOSIS — R51 Headache: Principal | ICD-10-CM

## 2018-04-05 DIAGNOSIS — G319 Degenerative disease of nervous system, unspecified: Secondary | ICD-10-CM | POA: Diagnosis not present

## 2018-04-05 DIAGNOSIS — I6782 Cerebral ischemia: Secondary | ICD-10-CM | POA: Insufficient documentation

## 2018-04-05 DIAGNOSIS — R410 Disorientation, unspecified: Secondary | ICD-10-CM | POA: Diagnosis present

## 2018-04-07 DIAGNOSIS — I482 Chronic atrial fibrillation: Secondary | ICD-10-CM | POA: Diagnosis not present

## 2018-04-07 DIAGNOSIS — N183 Chronic kidney disease, stage 3 (moderate): Secondary | ICD-10-CM | POA: Diagnosis not present

## 2018-04-07 DIAGNOSIS — F0280 Dementia in other diseases classified elsewhere without behavioral disturbance: Secondary | ICD-10-CM | POA: Diagnosis not present

## 2018-04-07 DIAGNOSIS — G301 Alzheimer's disease with late onset: Secondary | ICD-10-CM | POA: Diagnosis not present

## 2018-04-07 DIAGNOSIS — I129 Hypertensive chronic kidney disease with stage 1 through stage 4 chronic kidney disease, or unspecified chronic kidney disease: Secondary | ICD-10-CM | POA: Diagnosis not present

## 2018-04-08 ENCOUNTER — Encounter: Payer: Self-pay | Admitting: Neurology

## 2018-04-08 ENCOUNTER — Ambulatory Visit (INDEPENDENT_AMBULATORY_CARE_PROVIDER_SITE_OTHER): Payer: Medicare Other | Admitting: Neurology

## 2018-04-08 ENCOUNTER — Telehealth: Payer: Self-pay | Admitting: Neurology

## 2018-04-08 VITALS — BP 92/63 | HR 88 | Ht 61.0 in | Wt 108.0 lb

## 2018-04-08 DIAGNOSIS — F0281 Dementia in other diseases classified elsewhere with behavioral disturbance: Secondary | ICD-10-CM

## 2018-04-08 DIAGNOSIS — G301 Alzheimer's disease with late onset: Secondary | ICD-10-CM | POA: Diagnosis not present

## 2018-04-08 DIAGNOSIS — F02818 Dementia in other diseases classified elsewhere, unspecified severity, with other behavioral disturbance: Secondary | ICD-10-CM

## 2018-04-08 MED ORDER — DIVALPROEX SODIUM ER 250 MG PO TB24: 250.0000 mg | ORAL_TABLET | Freq: Every day | ORAL | 1 refills | Status: AC

## 2018-04-08 NOTE — Progress Notes (Signed)
[facility] [address] [place]. [place] [address] [phone]       OFFICE CONSULT NOTE  Beth. Beth Johnson Date of Birth:  March 01, 1941 Medical Record Number:  [id number]   Referring MD:  Lajean Manes  Reason for Referral:  Dementia with behavioral agitation  HPI: Beth Johnson is a 77 year old Caucasian lady who is referred for dementia with behavioral agitation. She is accompanied by her daughter is health power of attorney and provides most of the history. Patient is actually known to me from previous visit 5 years ago. When I saw her once in consultation for worsening dementia falling recent embolic stroke. from atrial fibrillation.  The patient was lost to follow-up from me since 2014. The daughter informs me today that there has been a gradual progression of her dementia. She was living at [facility] for years but for the last year and a half has moved in to [facility]. She's had progressive cognitive decline and has poor short-term memory. She gets confused easily. She gets disoriented at times. She was started on buspirone by Dr. Felipa Eth and initially did well but recently her frustration and agitation have increased. She also has nightmares as well as hallucinations. The patient may be bullied by the other residents at her independent living facility. She has increased trouble processing information and in fact have wandered off once and the facility had to call the cops to find her. The daughter has now arranged for 24 7 care at the facility. She had a fall four and half weeks ago. She was seen in the ER and fortunately had no major injuries. She had a CT scan of the head done on 04/05/18 which I personally reviewed shows old right frontal infarct and changes of small vessel disease without any hemorrhage or acute abnormality. She was previously on Xarelto but has been switched to eliquis for anticoagulation. The patient tried  Aricept for her dementia but had trouble tolerating it and did not help her. She does have some chronic diarrhea at baseline. She was started on 07/10/2027 milligrams initially by Dr. Felipa Eth but due to concerns about kidney function the dose has been reduced to 40 mg but the daughter feels that grams Randall Hiss really has not helped also. I had placed her onset of 4 and previously which also did not help her and was stopped. The patient is claustrophobic and autofusion not be able to cooperate for an MRI. There has been no witnessed seizure activity noted. The daughter is not thinking about moving the patient into a memory care unit.  Notes from last visit 10/20/2013 : She was admitted on 11/I7/14 to [facility] with sudden onset of dysarthria and left facial droop. She also was found to have new-onset atrial fibrillation and flutter. CT angiogram of the brain and neck did not reveal any large vessel stenosis. I do not have actual reports or films to look at but the patient's daughter had her electronic medical records on her I pad which I was able to scroll through and go through the records and review them. Lipid profile was normal hemoglobin A1c was 5.3 RPR was negative TSH was normal. CT scan showed a right frontal hypodensity consistent with an subacute stroke. MRI was attempted but patient was extremely claustrophobic and hence it could not be completed. Patient recovered her speech and left facial weakness quite soon. She however has had noted cognitive decline. The patient daughter states that she  was aware of patient's progressive memory decline for the last couple of years. She lives alone in [place] but since her daughter lives in this 4 she was transferred for rehabilitation to [facility] here in [place]. Patient is improving and the daughter is not thinking of patient moving back home to [place]. She has had issues with sundowning and mild agitation  and anxiety and has been started on Lexapro one week ago. She has had no issues with gait balance or falls. She does not have any delusions or hallucinations. Patient has never been tried on medications like Aricept. She has been started on xarelto for her atrial fibrillation and seems to tolerating it well with only minor bruising and no bleeding. There is no family history of dementia. Patient has no history of seizures, significant head injury with loss of consciousness or other strokes. She has a two-year college degree and is retired and lives alone.  ROS:   14 system review of systems is positive for  memory loss, confusion, anxiety, hallucinations, frustration, agitation, wandering off, weight loss and all other systems negative  PMH:  Past Medical History:  Diagnosis Date  . Atrial fibrillation ([place])   . CVA (cerebral infarction)    new onset A. fib with CVA 09/2013: LUE weakness, left visual field deficit, facial draw, altered cognition  . Depression   . Dysrhythmia   . GERD (gastroesophageal reflux disease)   . Hypertension   . Hypothyroidism   . Stroke [facility]) 09/29/13   affected memory and comprehension    Social History:  Social History   Socioeconomic History  . Marital status: Divorced    Spouse name: Not on file  . Number of children: 1  . Years of education: college  . Highest education level: Not on file  Occupational History  . Occupation: retired  Scientific laboratory technician  . Financial resource strain: Not on file  . Food insecurity:    Worry: Not on file    Inability: Not on file  . Transportation needs:    Medical: Not on file    Non-medical: Not on file  Tobacco Use  . Smoking status: Never Smoker  . Smokeless tobacco: Never Used  Substance and Sexual Activity  . Alcohol use: Not Currently    Alcohol/week: 1.2 oz    Types: 2 Glasses of wine per week    Comment: occasional  . Drug use: No  . Sexual activity: Not on file  Lifestyle  . Physical activity:     Days per week: Not on file    Minutes per session: Not on file  . Stress: Not on file  Relationships  . Social connections:    Talks on phone: Not on file    Gets together: Not on file    Attends religious service: Not on file    Active member of club or organization: Not on file    Attends meetings of clubs or organizations: Not on file    Relationship status: Not on file  . Intimate partner violence:    Fear of current or ex partner: Not on file    Emotionally abused: Not on file    Physically abused: Not on file    Forced sexual activity: Not on file  Other Topics Concern  . Not on file  Social History Narrative  . Not on file    Medications:   Current Outpatient Medications on File Prior to Visit  Medication Sig Dispense Refill  . busPIRone (BUSPAR) 5  MG tablet Take 5 mg by mouth 3 (three) times daily.    Marland Kitchen diltiazem (TIAZAC) 240 MG 24 hr capsule Take 240 mg by mouth daily.    Marland Kitchen ELIQUIS 5 MG TABS tablet Take 5 oz by mouth 2 (two) times daily.    Marland Kitchen escitalopram (LEXAPRO) 20 MG tablet Take 20 mg by mouth daily.    . furosemide (LASIX) 20 MG tablet Take 20 mg by mouth.    . lansoprazole (PREVACID) 15 MG capsule Take 15 mg by mouth daily.    Marland Kitchen levothyroxine (SYNTHROID, LEVOTHROID) 50 MCG tablet Take 50 mcg by mouth at bedtime.     Marland Kitchen loperamide (HM LOPERAMIDE HCL) 2 MG capsule Take 2 mg by mouth at bedtime.     Marland Kitchen NAMZARIC 14-10 MG CP24     . omeprazole (PRILOSEC) 40 MG capsule Take 40 mg by mouth daily.     No current facility-administered medications on file prior to visit.     Allergies:   Allergies  Allergen Reactions  . Adhesive [Tape]   . Amoxicillin Other (See Comments)    unknown  . Latex Itching and Rash  . Penicillins Other (See Comments)    unknown  . Sulfa Antibiotics Other (See Comments)    unknown    Physical Exam General: Frail elderly Caucasian lady, seated, in no evident distress Head: head normocephalic and atraumatic.   Neck: supple with no  carotid or supraclavicular bruits Cardiovascular: regular rate and rhythm, no murmurs Musculoskeletal: no deformity Skin:  no rash but multiple scattered petichiae Vascular:  Normal pulses all extremities  Neurologic Exam Mental Status: Drowsy and can fall asleep easily but can be aroused. Uncooperative with exam. Wants to be left alone and refuses to cooperate with detailed cognitive testing. Recent and remote memory poor t. Attention span, concentration and fund of knowledge diminished. Mood and affect appear depressed and frustrated Cranial Nerves: Fundoscopic exam not cooperative. Pupils equal, briskly reactive to light. Extraocular movements full without nystagmus. Visual fields full to confrontation. Hearing intact. Facial sensation intact. Face, tongue, palate moves normally and symmetrically.  Motor: Normal bulk and tone. Normal strength in all tested extremity muscles. Sensory.: intact to touch , pinprick , position and vibratory sensation.  Coordination: Rapid alternating movements ,. Finger-to-nose and heel-to-shin  Not performed due to lack of cooperation Gait and Station: Arises from chair with some  difficulty. Stance is normal. Gait demonstrates normal stride length and balance .   Reflexes: 1+ and symmetric. Toes downgoing.       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 Remote history of right frontal infarct from  atrial fibrillation in November 2014.    PLAN: I had a long discussion with the patient and her daughter regarding her behavior or agitation and progressive dementia which is likely mixed vascular and Alzheimer's type. I recommend trial of Depakote ER for her behavioral agitation and mood stabilization. She may need to consider reducing the dose of spine on in the future after discussion with her primary physician. Continue Nam zaric in the current  dose. Check EEG for silent seizure activity. Patient may have to move to a memory care unit in the near future due to her progressive dementia and behavioral issues  . Greater than 50% time during this 45 minute consultation visit was spent on counseling and coordination of care about her progressive dementia and behavioral agitation and answering questions Return for follow-up  in 2 months or call earlier if necessary Antony Contras, MD  [facility] [address] [place], [place] [address]  Phone [phone] Fax [phone] Note: This document was prepared with digital dictation and possible smart phrase technology. Any transcriptional errors that result from this process are unintentional.

## 2018-04-08 NOTE — Patient Instructions (Addendum)
I had a long discussion with the patient and her daughter regarding her behavior or agitation and progressive dementia which is likely mixed vascular and Alzheimer's type. I recommend trial of Depakote ER for her behavioral agitation and mood stabilization. She may need to consider reducing the dose of spine on in the future after discussion with her primary physician. Continue Nam Zarick in the current dose. Check EEG for silent seizure activity. Patient may have to move to a memory care unit in the near future due to her progressive dementia and behavioral issues   Return for follow-up in 2 months or call earlier if necessary

## 2018-04-08 NOTE — Telephone Encounter (Signed)
Patient state she will call us to schedule EEG and 2 month follow-up per Dr. Leonie Man.

## 2018-04-28 DIAGNOSIS — S51812A Laceration without foreign body of left forearm, initial encounter: Secondary | ICD-10-CM | POA: Diagnosis not present

## 2018-04-28 DIAGNOSIS — S41111A Laceration without foreign body of right upper arm, initial encounter: Secondary | ICD-10-CM | POA: Diagnosis not present

## 2018-05-01 DIAGNOSIS — I69334 Monoplegia of upper limb following cerebral infarction affecting left non-dominant side: Secondary | ICD-10-CM | POA: Diagnosis not present

## 2018-05-01 DIAGNOSIS — H5462 Unqualified visual loss, left eye, normal vision right eye: Secondary | ICD-10-CM | POA: Diagnosis not present

## 2018-05-01 DIAGNOSIS — N184 Chronic kidney disease, stage 4 (severe): Secondary | ICD-10-CM | POA: Diagnosis not present

## 2018-05-01 DIAGNOSIS — I482 Chronic atrial fibrillation: Secondary | ICD-10-CM | POA: Diagnosis not present

## 2018-05-01 DIAGNOSIS — I130 Hypertensive heart and chronic kidney disease with heart failure and stage 1 through stage 4 chronic kidney disease, or unspecified chronic kidney disease: Secondary | ICD-10-CM | POA: Diagnosis not present

## 2018-05-01 DIAGNOSIS — I69398 Other sequelae of cerebral infarction: Secondary | ICD-10-CM | POA: Diagnosis not present

## 2018-05-01 DIAGNOSIS — S51011D Laceration without foreign body of right elbow, subsequent encounter: Secondary | ICD-10-CM | POA: Diagnosis not present

## 2018-05-01 DIAGNOSIS — F0280 Dementia in other diseases classified elsewhere without behavioral disturbance: Secondary | ICD-10-CM | POA: Diagnosis not present

## 2018-05-01 DIAGNOSIS — H9192 Unspecified hearing loss, left ear: Secondary | ICD-10-CM | POA: Diagnosis not present

## 2018-05-01 DIAGNOSIS — G301 Alzheimer's disease with late onset: Secondary | ICD-10-CM | POA: Diagnosis not present

## 2018-05-01 DIAGNOSIS — D508 Other iron deficiency anemias: Secondary | ICD-10-CM | POA: Diagnosis not present

## 2018-05-01 DIAGNOSIS — I5032 Chronic diastolic (congestive) heart failure: Secondary | ICD-10-CM | POA: Diagnosis not present

## 2018-05-02 DIAGNOSIS — S51011D Laceration without foreign body of right elbow, subsequent encounter: Secondary | ICD-10-CM | POA: Diagnosis not present

## 2018-05-02 DIAGNOSIS — G301 Alzheimer's disease with late onset: Secondary | ICD-10-CM | POA: Diagnosis not present

## 2018-05-06 DIAGNOSIS — G301 Alzheimer's disease with late onset: Secondary | ICD-10-CM | POA: Diagnosis not present

## 2018-05-06 DIAGNOSIS — S51011D Laceration without foreign body of right elbow, subsequent encounter: Secondary | ICD-10-CM | POA: Diagnosis not present

## 2018-05-10 DIAGNOSIS — G301 Alzheimer's disease with late onset: Secondary | ICD-10-CM | POA: Diagnosis not present

## 2018-05-10 DIAGNOSIS — S51011D Laceration without foreign body of right elbow, subsequent encounter: Secondary | ICD-10-CM | POA: Diagnosis not present

## 2018-05-13 DIAGNOSIS — S51011D Laceration without foreign body of right elbow, subsequent encounter: Secondary | ICD-10-CM | POA: Diagnosis not present

## 2018-05-13 DIAGNOSIS — G301 Alzheimer's disease with late onset: Secondary | ICD-10-CM | POA: Diagnosis not present

## 2018-05-20 ENCOUNTER — Institutional Professional Consult (permissible substitution): Payer: Medicare Other | Admitting: Neurology

## 2018-05-28 DIAGNOSIS — N184 Chronic kidney disease, stage 4 (severe): Secondary | ICD-10-CM | POA: Diagnosis not present

## 2018-05-28 DIAGNOSIS — I482 Chronic atrial fibrillation: Secondary | ICD-10-CM | POA: Diagnosis not present

## 2018-05-28 DIAGNOSIS — D508 Other iron deficiency anemias: Secondary | ICD-10-CM | POA: Diagnosis not present

## 2018-05-28 DIAGNOSIS — I130 Hypertensive heart and chronic kidney disease with heart failure and stage 1 through stage 4 chronic kidney disease, or unspecified chronic kidney disease: Secondary | ICD-10-CM | POA: Diagnosis not present

## 2018-05-28 DIAGNOSIS — G301 Alzheimer's disease with late onset: Secondary | ICD-10-CM | POA: Diagnosis not present

## 2018-05-28 DIAGNOSIS — F0280 Dementia in other diseases classified elsewhere without behavioral disturbance: Secondary | ICD-10-CM | POA: Diagnosis not present

## 2018-05-28 DIAGNOSIS — I69334 Monoplegia of upper limb following cerebral infarction affecting left non-dominant side: Secondary | ICD-10-CM | POA: Diagnosis not present

## 2018-05-28 DIAGNOSIS — I69398 Other sequelae of cerebral infarction: Secondary | ICD-10-CM | POA: Diagnosis not present

## 2018-05-28 DIAGNOSIS — H5462 Unqualified visual loss, left eye, normal vision right eye: Secondary | ICD-10-CM | POA: Diagnosis not present

## 2018-05-28 DIAGNOSIS — I509 Heart failure, unspecified: Secondary | ICD-10-CM | POA: Diagnosis not present

## 2018-05-28 DIAGNOSIS — S41112D Laceration without foreign body of left upper arm, subsequent encounter: Secondary | ICD-10-CM | POA: Diagnosis not present

## 2018-05-28 DIAGNOSIS — S41101D Unspecified open wound of right upper arm, subsequent encounter: Secondary | ICD-10-CM | POA: Diagnosis not present

## 2018-06-04 DIAGNOSIS — S41112D Laceration without foreign body of left upper arm, subsequent encounter: Secondary | ICD-10-CM | POA: Diagnosis not present

## 2018-06-04 DIAGNOSIS — S41101D Unspecified open wound of right upper arm, subsequent encounter: Secondary | ICD-10-CM | POA: Diagnosis not present

## 2018-06-07 DIAGNOSIS — Z7901 Long term (current) use of anticoagulants: Secondary | ICD-10-CM | POA: Diagnosis not present

## 2018-06-07 DIAGNOSIS — R531 Weakness: Secondary | ICD-10-CM | POA: Diagnosis not present

## 2018-06-07 DIAGNOSIS — I482 Chronic atrial fibrillation: Secondary | ICD-10-CM | POA: Diagnosis not present

## 2018-06-07 DIAGNOSIS — K219 Gastro-esophageal reflux disease without esophagitis: Secondary | ICD-10-CM | POA: Diagnosis present

## 2018-06-07 DIAGNOSIS — F329 Major depressive disorder, single episode, unspecified: Secondary | ICD-10-CM | POA: Diagnosis not present

## 2018-06-07 DIAGNOSIS — G4700 Insomnia, unspecified: Secondary | ICD-10-CM | POA: Diagnosis present

## 2018-06-07 DIAGNOSIS — Z880 Allergy status to penicillin: Secondary | ICD-10-CM | POA: Diagnosis not present

## 2018-06-07 DIAGNOSIS — Z882 Allergy status to sulfonamides status: Secondary | ICD-10-CM | POA: Diagnosis not present

## 2018-06-07 DIAGNOSIS — Z79899 Other long term (current) drug therapy: Secondary | ICD-10-CM | POA: Diagnosis not present

## 2018-06-07 DIAGNOSIS — F0281 Dementia in other diseases classified elsewhere with behavioral disturbance: Secondary | ICD-10-CM | POA: Diagnosis not present

## 2018-06-07 DIAGNOSIS — F0391 Unspecified dementia with behavioral disturbance: Secondary | ICD-10-CM | POA: Diagnosis not present

## 2018-06-07 DIAGNOSIS — I4891 Unspecified atrial fibrillation: Secondary | ICD-10-CM | POA: Diagnosis not present

## 2018-06-07 DIAGNOSIS — Z888 Allergy status to other drugs, medicaments and biological substances status: Secondary | ICD-10-CM | POA: Diagnosis not present

## 2018-06-07 DIAGNOSIS — G309 Alzheimer's disease, unspecified: Secondary | ICD-10-CM | POA: Diagnosis not present

## 2018-06-07 DIAGNOSIS — E039 Hypothyroidism, unspecified: Secondary | ICD-10-CM | POA: Diagnosis not present

## 2018-06-07 DIAGNOSIS — Z96643 Presence of artificial hip joint, bilateral: Secondary | ICD-10-CM | POA: Diagnosis present

## 2018-06-07 DIAGNOSIS — I129 Hypertensive chronic kidney disease with stage 1 through stage 4 chronic kidney disease, or unspecified chronic kidney disease: Secondary | ICD-10-CM | POA: Diagnosis not present

## 2018-06-07 DIAGNOSIS — I10 Essential (primary) hypertension: Secondary | ICD-10-CM | POA: Diagnosis not present

## 2018-06-07 DIAGNOSIS — G3184 Mild cognitive impairment, so stated: Secondary | ICD-10-CM | POA: Diagnosis not present

## 2018-06-07 DIAGNOSIS — N183 Chronic kidney disease, stage 3 (moderate): Secondary | ICD-10-CM | POA: Diagnosis not present

## 2018-06-07 DIAGNOSIS — R4182 Altered mental status, unspecified: Secondary | ICD-10-CM | POA: Diagnosis not present

## 2018-06-07 DIAGNOSIS — E7800 Pure hypercholesterolemia, unspecified: Secondary | ICD-10-CM | POA: Diagnosis not present

## 2018-06-07 DIAGNOSIS — N184 Chronic kidney disease, stage 4 (severe): Secondary | ICD-10-CM | POA: Diagnosis not present

## 2018-06-07 DIAGNOSIS — D509 Iron deficiency anemia, unspecified: Secondary | ICD-10-CM | POA: Diagnosis present

## 2018-07-01 DIAGNOSIS — E039 Hypothyroidism, unspecified: Secondary | ICD-10-CM | POA: Diagnosis not present

## 2018-07-01 DIAGNOSIS — S51812D Laceration without foreign body of left forearm, subsequent encounter: Secondary | ICD-10-CM | POA: Diagnosis not present

## 2018-07-01 DIAGNOSIS — I69318 Other symptoms and signs involving cognitive functions following cerebral infarction: Secondary | ICD-10-CM | POA: Diagnosis not present

## 2018-07-01 DIAGNOSIS — H5340 Unspecified visual field defects: Secondary | ICD-10-CM | POA: Diagnosis not present

## 2018-07-01 DIAGNOSIS — I130 Hypertensive heart and chronic kidney disease with heart failure and stage 1 through stage 4 chronic kidney disease, or unspecified chronic kidney disease: Secondary | ICD-10-CM | POA: Diagnosis not present

## 2018-07-01 DIAGNOSIS — F329 Major depressive disorder, single episode, unspecified: Secondary | ICD-10-CM | POA: Diagnosis not present

## 2018-07-01 DIAGNOSIS — G301 Alzheimer's disease with late onset: Secondary | ICD-10-CM | POA: Diagnosis not present

## 2018-07-01 DIAGNOSIS — I69334 Monoplegia of upper limb following cerebral infarction affecting left non-dominant side: Secondary | ICD-10-CM | POA: Diagnosis not present

## 2018-07-01 DIAGNOSIS — I69398 Other sequelae of cerebral infarction: Secondary | ICD-10-CM | POA: Diagnosis not present

## 2018-07-01 DIAGNOSIS — I69392 Facial weakness following cerebral infarction: Secondary | ICD-10-CM | POA: Diagnosis not present

## 2018-07-01 DIAGNOSIS — I509 Heart failure, unspecified: Secondary | ICD-10-CM | POA: Diagnosis not present

## 2018-07-01 DIAGNOSIS — N183 Chronic kidney disease, stage 3 (moderate): Secondary | ICD-10-CM | POA: Diagnosis not present

## 2018-07-01 DIAGNOSIS — F0281 Dementia in other diseases classified elsewhere with behavioral disturbance: Secondary | ICD-10-CM | POA: Diagnosis not present

## 2018-07-01 DIAGNOSIS — I482 Chronic atrial fibrillation: Secondary | ICD-10-CM | POA: Diagnosis not present

## 2018-07-01 DIAGNOSIS — H409 Unspecified glaucoma: Secondary | ICD-10-CM | POA: Diagnosis not present

## 2018-07-01 DIAGNOSIS — Z7901 Long term (current) use of anticoagulants: Secondary | ICD-10-CM | POA: Diagnosis not present

## 2018-07-01 DIAGNOSIS — I4892 Unspecified atrial flutter: Secondary | ICD-10-CM | POA: Diagnosis not present

## 2018-07-08 DIAGNOSIS — F0281 Dementia in other diseases classified elsewhere with behavioral disturbance: Secondary | ICD-10-CM | POA: Diagnosis not present

## 2018-07-08 DIAGNOSIS — I130 Hypertensive heart and chronic kidney disease with heart failure and stage 1 through stage 4 chronic kidney disease, or unspecified chronic kidney disease: Secondary | ICD-10-CM | POA: Diagnosis not present

## 2018-07-08 DIAGNOSIS — I509 Heart failure, unspecified: Secondary | ICD-10-CM | POA: Diagnosis not present

## 2018-07-08 DIAGNOSIS — I482 Chronic atrial fibrillation: Secondary | ICD-10-CM | POA: Diagnosis not present

## 2018-07-08 DIAGNOSIS — E039 Hypothyroidism, unspecified: Secondary | ICD-10-CM | POA: Diagnosis not present

## 2018-07-08 DIAGNOSIS — G301 Alzheimer's disease with late onset: Secondary | ICD-10-CM | POA: Diagnosis not present

## 2018-07-12 DIAGNOSIS — I509 Heart failure, unspecified: Secondary | ICD-10-CM | POA: Diagnosis not present

## 2018-07-12 DIAGNOSIS — G301 Alzheimer's disease with late onset: Secondary | ICD-10-CM | POA: Diagnosis not present

## 2018-07-12 DIAGNOSIS — I130 Hypertensive heart and chronic kidney disease with heart failure and stage 1 through stage 4 chronic kidney disease, or unspecified chronic kidney disease: Secondary | ICD-10-CM | POA: Diagnosis not present

## 2018-07-12 DIAGNOSIS — I482 Chronic atrial fibrillation: Secondary | ICD-10-CM | POA: Diagnosis not present

## 2018-07-12 DIAGNOSIS — F0281 Dementia in other diseases classified elsewhere with behavioral disturbance: Secondary | ICD-10-CM | POA: Diagnosis not present

## 2018-07-12 DIAGNOSIS — E039 Hypothyroidism, unspecified: Secondary | ICD-10-CM | POA: Diagnosis not present

## 2018-07-14 ENCOUNTER — Telehealth: Payer: Self-pay | Admitting: Cardiology

## 2018-07-14 NOTE — Telephone Encounter (Signed)
New Message:     Pt is in a memory facility([facility]). The Internist at the facility wants to know if patient can stop her Eliquis.? The reason for this is, because the patient is falling a lot.

## 2018-07-14 NOTE — Telephone Encounter (Signed)
Will ask Dr Marlou Porch.  Pt has not been seen since 4/18.  Recall letter was sent.

## 2018-07-15 DIAGNOSIS — F0281 Dementia in other diseases classified elsewhere with behavioral disturbance: Secondary | ICD-10-CM | POA: Diagnosis not present

## 2018-07-15 DIAGNOSIS — E039 Hypothyroidism, unspecified: Secondary | ICD-10-CM | POA: Diagnosis not present

## 2018-07-15 DIAGNOSIS — G301 Alzheimer's disease with late onset: Secondary | ICD-10-CM | POA: Diagnosis not present

## 2018-07-15 DIAGNOSIS — I482 Chronic atrial fibrillation: Secondary | ICD-10-CM | POA: Diagnosis not present

## 2018-07-15 DIAGNOSIS — I130 Hypertensive heart and chronic kidney disease with heart failure and stage 1 through stage 4 chronic kidney disease, or unspecified chronic kidney disease: Secondary | ICD-10-CM | POA: Diagnosis not present

## 2018-07-15 DIAGNOSIS — I509 Heart failure, unspecified: Secondary | ICD-10-CM | POA: Diagnosis not present

## 2018-07-16 NOTE — Telephone Encounter (Signed)
Per Dr Marlou Porch - due to pt increased fall risk and progressive dementia she may d/c Eliquis however this does increase her risk of stroke with At Fib.   Has fallen 5 times recently.  Skin tears frequently. Per Florentina Jenny who is aware pt may d/c Eliquis.

## 2018-07-19 NOTE — Telephone Encounter (Signed)
Agree with plan Adarius Tigges, MD  

## 2018-07-21 DIAGNOSIS — I482 Chronic atrial fibrillation: Secondary | ICD-10-CM | POA: Diagnosis not present

## 2018-07-21 DIAGNOSIS — I509 Heart failure, unspecified: Secondary | ICD-10-CM | POA: Diagnosis not present

## 2018-07-21 DIAGNOSIS — I130 Hypertensive heart and chronic kidney disease with heart failure and stage 1 through stage 4 chronic kidney disease, or unspecified chronic kidney disease: Secondary | ICD-10-CM | POA: Diagnosis not present

## 2018-07-21 DIAGNOSIS — G301 Alzheimer's disease with late onset: Secondary | ICD-10-CM | POA: Diagnosis not present

## 2018-07-21 DIAGNOSIS — F0281 Dementia in other diseases classified elsewhere with behavioral disturbance: Secondary | ICD-10-CM | POA: Diagnosis not present

## 2018-07-21 DIAGNOSIS — E039 Hypothyroidism, unspecified: Secondary | ICD-10-CM | POA: Diagnosis not present

## 2018-07-26 DIAGNOSIS — I509 Heart failure, unspecified: Secondary | ICD-10-CM | POA: Diagnosis not present

## 2018-07-26 DIAGNOSIS — F0281 Dementia in other diseases classified elsewhere with behavioral disturbance: Secondary | ICD-10-CM | POA: Diagnosis not present

## 2018-07-26 DIAGNOSIS — I130 Hypertensive heart and chronic kidney disease with heart failure and stage 1 through stage 4 chronic kidney disease, or unspecified chronic kidney disease: Secondary | ICD-10-CM | POA: Diagnosis not present

## 2018-07-26 DIAGNOSIS — G301 Alzheimer's disease with late onset: Secondary | ICD-10-CM | POA: Diagnosis not present

## 2018-07-26 DIAGNOSIS — I482 Chronic atrial fibrillation: Secondary | ICD-10-CM | POA: Diagnosis not present

## 2018-07-26 DIAGNOSIS — E039 Hypothyroidism, unspecified: Secondary | ICD-10-CM | POA: Diagnosis not present

## 2018-07-28 DIAGNOSIS — M19042 Primary osteoarthritis, left hand: Secondary | ICD-10-CM | POA: Diagnosis not present

## 2018-07-29 DIAGNOSIS — F064 Anxiety disorder due to known physiological condition: Secondary | ICD-10-CM | POA: Diagnosis not present

## 2018-07-29 DIAGNOSIS — F5105 Insomnia due to other mental disorder: Secondary | ICD-10-CM | POA: Diagnosis not present

## 2018-07-29 DIAGNOSIS — G301 Alzheimer's disease with late onset: Secondary | ICD-10-CM | POA: Diagnosis not present

## 2018-07-29 DIAGNOSIS — F0281 Dementia in other diseases classified elsewhere with behavioral disturbance: Secondary | ICD-10-CM | POA: Diagnosis not present

## 2018-08-02 DIAGNOSIS — M25532 Pain in left wrist: Secondary | ICD-10-CM | POA: Diagnosis not present

## 2018-08-03 ENCOUNTER — Telehealth: Payer: Self-pay | Admitting: Cardiology

## 2018-08-03 NOTE — Telephone Encounter (Signed)
New Message          Patient son-law called today asking Dr. Marlou Porch to fax a letter to [facility] for patient to stop the "Asprin" and why it's being stopped Attn: Loa Socks fax # [phone]. Pls call and advise.

## 2018-08-03 NOTE — Telephone Encounter (Signed)
Eliquis was discontinued 07/16/18 due to frequent falls/dementia.

## 2018-08-03 NOTE — Telephone Encounter (Signed)
Since she is currently taking anticoagulation with Eliquis, I would consider stopping aspirin as this increases her risk of bleeding.  If she were not on Eliquis, by all means, I would recommend aspirin. Candee Furbish, MD

## 2018-08-03 NOTE — Telephone Encounter (Signed)
Bill called back to report per wife, Dr Dillard Essex ordered the ASA but refuses to discontinue it.  Family feels as though pt should not be taking it in her current condition.  Advised again I will review with Dr Marlou Porch.

## 2018-08-03 NOTE — Telephone Encounter (Signed)
Spoke with Rush Landmark - advised this office was unaware of pt being RXed ASA and an order to d/c it should come from the MD who started it.  He is requesting Dr Marlou Porch sent a "note" explaining why the patient should not be taking it and that ASA is detrimental to her health.  Advised I will review with Dr Marlou Porch however they should make this request of the ordering MD.  Rush Landmark is aware I will c/b if any further instructions/recommendations

## 2018-08-03 NOTE — Telephone Encounter (Signed)
After reviewing the chart.  Dr Marlou Porch has never RXed ASA for this pt and we have no record of her taking it.  LM for BIll (on DPR) of this information and that he would need to request ASA be d/ced by the MD who ordered it.  Requested he c/b with any questions or concerns.

## 2018-08-04 ENCOUNTER — Encounter: Payer: Self-pay | Admitting: *Deleted

## 2018-08-04 NOTE — Telephone Encounter (Signed)
Advised Bill of letter and that it has been faxed to Loa Socks as requested.

## 2018-08-04 NOTE — Telephone Encounter (Signed)
Daughter, healthcare power of attorney and son/family desire for Ms. Barbe to be off of both Eliquis as well as aspirin.  This does increase her risk of stroke as it relates to atrial fibrillation (being off of Eliquis in particular).  They understand this risk and take full responsibility.  They are more concerned at this point about her frequent falls with her underlying dementia potentially causing an increased bleeding risk.  Candee Furbish, MD

## 2018-08-04 NOTE — Telephone Encounter (Signed)
Information as dictated by Dr Marlou Porch has been turned into a letter and faxed to Loa Socks as requested by [employer].

## 2018-08-11 DIAGNOSIS — R600 Localized edema: Secondary | ICD-10-CM | POA: Diagnosis not present

## 2018-08-11 DIAGNOSIS — G309 Alzheimer's disease, unspecified: Secondary | ICD-10-CM | POA: Diagnosis not present

## 2018-08-11 DIAGNOSIS — S51812A Laceration without foreign body of left forearm, initial encounter: Secondary | ICD-10-CM | POA: Diagnosis not present

## 2018-08-12 DIAGNOSIS — F0150 Vascular dementia without behavioral disturbance: Secondary | ICD-10-CM | POA: Diagnosis not present

## 2018-08-13 DIAGNOSIS — Z79899 Other long term (current) drug therapy: Secondary | ICD-10-CM | POA: Diagnosis not present

## 2018-08-16 DIAGNOSIS — D509 Iron deficiency anemia, unspecified: Secondary | ICD-10-CM | POA: Diagnosis not present

## 2018-08-16 DIAGNOSIS — N183 Chronic kidney disease, stage 3 (moderate): Secondary | ICD-10-CM | POA: Diagnosis not present

## 2018-08-16 DIAGNOSIS — I4891 Unspecified atrial fibrillation: Secondary | ICD-10-CM | POA: Diagnosis not present

## 2018-08-16 DIAGNOSIS — G309 Alzheimer's disease, unspecified: Secondary | ICD-10-CM | POA: Diagnosis not present

## 2018-08-16 DIAGNOSIS — E0590 Thyrotoxicosis, unspecified without thyrotoxic crisis or storm: Secondary | ICD-10-CM | POA: Diagnosis not present

## 2018-08-16 DIAGNOSIS — S51812D Laceration without foreign body of left forearm, subsequent encounter: Secondary | ICD-10-CM | POA: Diagnosis not present

## 2018-08-16 DIAGNOSIS — Z7901 Long term (current) use of anticoagulants: Secondary | ICD-10-CM | POA: Diagnosis not present

## 2018-08-16 DIAGNOSIS — I130 Hypertensive heart and chronic kidney disease with heart failure and stage 1 through stage 4 chronic kidney disease, or unspecified chronic kidney disease: Secondary | ICD-10-CM | POA: Diagnosis not present

## 2018-08-16 DIAGNOSIS — I5030 Unspecified diastolic (congestive) heart failure: Secondary | ICD-10-CM | POA: Diagnosis not present

## 2018-08-16 DIAGNOSIS — F0280 Dementia in other diseases classified elsewhere without behavioral disturbance: Secondary | ICD-10-CM | POA: Diagnosis not present

## 2018-08-17 DIAGNOSIS — E0580 Other thyrotoxicosis without thyrotoxic crisis or storm: Secondary | ICD-10-CM | POA: Diagnosis not present

## 2018-08-17 DIAGNOSIS — G309 Alzheimer's disease, unspecified: Secondary | ICD-10-CM | POA: Diagnosis not present

## 2018-08-18 DIAGNOSIS — Z79899 Other long term (current) drug therapy: Secondary | ICD-10-CM | POA: Diagnosis not present

## 2018-08-19 DIAGNOSIS — I5030 Unspecified diastolic (congestive) heart failure: Secondary | ICD-10-CM | POA: Diagnosis not present

## 2018-08-19 DIAGNOSIS — N183 Chronic kidney disease, stage 3 (moderate): Secondary | ICD-10-CM | POA: Diagnosis not present

## 2018-08-19 DIAGNOSIS — I130 Hypertensive heart and chronic kidney disease with heart failure and stage 1 through stage 4 chronic kidney disease, or unspecified chronic kidney disease: Secondary | ICD-10-CM | POA: Diagnosis not present

## 2018-08-19 DIAGNOSIS — G309 Alzheimer's disease, unspecified: Secondary | ICD-10-CM | POA: Diagnosis not present

## 2018-08-19 DIAGNOSIS — F0280 Dementia in other diseases classified elsewhere without behavioral disturbance: Secondary | ICD-10-CM | POA: Diagnosis not present

## 2018-08-19 DIAGNOSIS — S51812D Laceration without foreign body of left forearm, subsequent encounter: Secondary | ICD-10-CM | POA: Diagnosis not present

## 2018-08-23 DIAGNOSIS — S51812D Laceration without foreign body of left forearm, subsequent encounter: Secondary | ICD-10-CM | POA: Diagnosis not present

## 2018-08-23 DIAGNOSIS — I5030 Unspecified diastolic (congestive) heart failure: Secondary | ICD-10-CM | POA: Diagnosis not present

## 2018-08-23 DIAGNOSIS — I130 Hypertensive heart and chronic kidney disease with heart failure and stage 1 through stage 4 chronic kidney disease, or unspecified chronic kidney disease: Secondary | ICD-10-CM | POA: Diagnosis not present

## 2018-08-23 DIAGNOSIS — N183 Chronic kidney disease, stage 3 (moderate): Secondary | ICD-10-CM | POA: Diagnosis not present

## 2018-08-23 DIAGNOSIS — F0280 Dementia in other diseases classified elsewhere without behavioral disturbance: Secondary | ICD-10-CM | POA: Diagnosis not present

## 2018-08-23 DIAGNOSIS — G309 Alzheimer's disease, unspecified: Secondary | ICD-10-CM | POA: Diagnosis not present

## 2018-08-24 DIAGNOSIS — G309 Alzheimer's disease, unspecified: Secondary | ICD-10-CM | POA: Diagnosis not present

## 2018-08-26 DIAGNOSIS — I5030 Unspecified diastolic (congestive) heart failure: Secondary | ICD-10-CM | POA: Diagnosis not present

## 2018-08-26 DIAGNOSIS — N183 Chronic kidney disease, stage 3 (moderate): Secondary | ICD-10-CM | POA: Diagnosis not present

## 2018-08-26 DIAGNOSIS — S51812D Laceration without foreign body of left forearm, subsequent encounter: Secondary | ICD-10-CM | POA: Diagnosis not present

## 2018-08-26 DIAGNOSIS — F0280 Dementia in other diseases classified elsewhere without behavioral disturbance: Secondary | ICD-10-CM | POA: Diagnosis not present

## 2018-08-26 DIAGNOSIS — I130 Hypertensive heart and chronic kidney disease with heart failure and stage 1 through stage 4 chronic kidney disease, or unspecified chronic kidney disease: Secondary | ICD-10-CM | POA: Diagnosis not present

## 2018-08-26 DIAGNOSIS — G309 Alzheimer's disease, unspecified: Secondary | ICD-10-CM | POA: Diagnosis not present

## 2018-08-30 DIAGNOSIS — N183 Chronic kidney disease, stage 3 (moderate): Secondary | ICD-10-CM | POA: Diagnosis not present

## 2018-08-30 DIAGNOSIS — S51812D Laceration without foreign body of left forearm, subsequent encounter: Secondary | ICD-10-CM | POA: Diagnosis not present

## 2018-08-30 DIAGNOSIS — F0280 Dementia in other diseases classified elsewhere without behavioral disturbance: Secondary | ICD-10-CM | POA: Diagnosis not present

## 2018-08-30 DIAGNOSIS — I5030 Unspecified diastolic (congestive) heart failure: Secondary | ICD-10-CM | POA: Diagnosis not present

## 2018-08-30 DIAGNOSIS — G309 Alzheimer's disease, unspecified: Secondary | ICD-10-CM | POA: Diagnosis not present

## 2018-08-30 DIAGNOSIS — I130 Hypertensive heart and chronic kidney disease with heart failure and stage 1 through stage 4 chronic kidney disease, or unspecified chronic kidney disease: Secondary | ICD-10-CM | POA: Diagnosis not present

## 2018-08-31 DIAGNOSIS — T148XXA Other injury of unspecified body region, initial encounter: Secondary | ICD-10-CM | POA: Diagnosis not present

## 2018-08-31 DIAGNOSIS — F0150 Vascular dementia without behavioral disturbance: Secondary | ICD-10-CM | POA: Diagnosis not present

## 2018-09-02 DIAGNOSIS — I5030 Unspecified diastolic (congestive) heart failure: Secondary | ICD-10-CM | POA: Diagnosis not present

## 2018-09-02 DIAGNOSIS — S51812D Laceration without foreign body of left forearm, subsequent encounter: Secondary | ICD-10-CM | POA: Diagnosis not present

## 2018-09-02 DIAGNOSIS — F0280 Dementia in other diseases classified elsewhere without behavioral disturbance: Secondary | ICD-10-CM | POA: Diagnosis not present

## 2018-09-02 DIAGNOSIS — N183 Chronic kidney disease, stage 3 (moderate): Secondary | ICD-10-CM | POA: Diagnosis not present

## 2018-09-02 DIAGNOSIS — I130 Hypertensive heart and chronic kidney disease with heart failure and stage 1 through stage 4 chronic kidney disease, or unspecified chronic kidney disease: Secondary | ICD-10-CM | POA: Diagnosis not present

## 2018-09-02 DIAGNOSIS — G309 Alzheimer's disease, unspecified: Secondary | ICD-10-CM | POA: Diagnosis not present

## 2018-09-06 DIAGNOSIS — S51812D Laceration without foreign body of left forearm, subsequent encounter: Secondary | ICD-10-CM | POA: Diagnosis not present

## 2018-09-06 DIAGNOSIS — I130 Hypertensive heart and chronic kidney disease with heart failure and stage 1 through stage 4 chronic kidney disease, or unspecified chronic kidney disease: Secondary | ICD-10-CM | POA: Diagnosis not present

## 2018-09-06 DIAGNOSIS — N183 Chronic kidney disease, stage 3 (moderate): Secondary | ICD-10-CM | POA: Diagnosis not present

## 2018-09-06 DIAGNOSIS — F0150 Vascular dementia without behavioral disturbance: Secondary | ICD-10-CM | POA: Diagnosis not present

## 2018-09-06 DIAGNOSIS — I5030 Unspecified diastolic (congestive) heart failure: Secondary | ICD-10-CM | POA: Diagnosis not present

## 2018-09-06 DIAGNOSIS — G309 Alzheimer's disease, unspecified: Secondary | ICD-10-CM | POA: Diagnosis not present

## 2018-09-06 DIAGNOSIS — F0280 Dementia in other diseases classified elsewhere without behavioral disturbance: Secondary | ICD-10-CM | POA: Diagnosis not present

## 2018-09-07 DIAGNOSIS — G309 Alzheimer's disease, unspecified: Secondary | ICD-10-CM | POA: Diagnosis not present

## 2018-09-07 DIAGNOSIS — L853 Xerosis cutis: Secondary | ICD-10-CM | POA: Diagnosis not present

## 2018-09-07 DIAGNOSIS — F0150 Vascular dementia without behavioral disturbance: Secondary | ICD-10-CM | POA: Diagnosis not present

## 2018-09-07 DIAGNOSIS — Z23 Encounter for immunization: Secondary | ICD-10-CM | POA: Diagnosis not present

## 2018-09-08 DIAGNOSIS — I130 Hypertensive heart and chronic kidney disease with heart failure and stage 1 through stage 4 chronic kidney disease, or unspecified chronic kidney disease: Secondary | ICD-10-CM | POA: Diagnosis not present

## 2018-09-08 DIAGNOSIS — F0280 Dementia in other diseases classified elsewhere without behavioral disturbance: Secondary | ICD-10-CM | POA: Diagnosis not present

## 2018-09-08 DIAGNOSIS — S51812D Laceration without foreign body of left forearm, subsequent encounter: Secondary | ICD-10-CM | POA: Diagnosis not present

## 2018-09-08 DIAGNOSIS — N183 Chronic kidney disease, stage 3 (moderate): Secondary | ICD-10-CM | POA: Diagnosis not present

## 2018-09-08 DIAGNOSIS — I5030 Unspecified diastolic (congestive) heart failure: Secondary | ICD-10-CM | POA: Diagnosis not present

## 2018-09-08 DIAGNOSIS — G309 Alzheimer's disease, unspecified: Secondary | ICD-10-CM | POA: Diagnosis not present

## 2018-09-08 DIAGNOSIS — Z79899 Other long term (current) drug therapy: Secondary | ICD-10-CM | POA: Diagnosis not present

## 2018-09-09 DIAGNOSIS — S51812D Laceration without foreign body of left forearm, subsequent encounter: Secondary | ICD-10-CM | POA: Diagnosis not present

## 2018-09-09 DIAGNOSIS — I130 Hypertensive heart and chronic kidney disease with heart failure and stage 1 through stage 4 chronic kidney disease, or unspecified chronic kidney disease: Secondary | ICD-10-CM | POA: Diagnosis not present

## 2018-09-09 DIAGNOSIS — G309 Alzheimer's disease, unspecified: Secondary | ICD-10-CM | POA: Diagnosis not present

## 2018-09-09 DIAGNOSIS — N183 Chronic kidney disease, stage 3 (moderate): Secondary | ICD-10-CM | POA: Diagnosis not present

## 2018-09-09 DIAGNOSIS — I5030 Unspecified diastolic (congestive) heart failure: Secondary | ICD-10-CM | POA: Diagnosis not present

## 2018-09-09 DIAGNOSIS — F0280 Dementia in other diseases classified elsewhere without behavioral disturbance: Secondary | ICD-10-CM | POA: Diagnosis not present

## 2018-09-10 DIAGNOSIS — F0150 Vascular dementia without behavioral disturbance: Secondary | ICD-10-CM | POA: Diagnosis not present

## 2018-09-13 DIAGNOSIS — N183 Chronic kidney disease, stage 3 (moderate): Secondary | ICD-10-CM | POA: Diagnosis not present

## 2018-09-13 DIAGNOSIS — I5030 Unspecified diastolic (congestive) heart failure: Secondary | ICD-10-CM | POA: Diagnosis not present

## 2018-09-13 DIAGNOSIS — I130 Hypertensive heart and chronic kidney disease with heart failure and stage 1 through stage 4 chronic kidney disease, or unspecified chronic kidney disease: Secondary | ICD-10-CM | POA: Diagnosis not present

## 2018-09-13 DIAGNOSIS — F0280 Dementia in other diseases classified elsewhere without behavioral disturbance: Secondary | ICD-10-CM | POA: Diagnosis not present

## 2018-09-13 DIAGNOSIS — S51812D Laceration without foreign body of left forearm, subsequent encounter: Secondary | ICD-10-CM | POA: Diagnosis not present

## 2018-09-13 DIAGNOSIS — G309 Alzheimer's disease, unspecified: Secondary | ICD-10-CM | POA: Diagnosis not present

## 2018-09-14 DIAGNOSIS — R0989 Other specified symptoms and signs involving the circulatory and respiratory systems: Secondary | ICD-10-CM | POA: Diagnosis not present

## 2018-09-14 DIAGNOSIS — R5383 Other fatigue: Secondary | ICD-10-CM | POA: Diagnosis not present

## 2018-09-14 DIAGNOSIS — N390 Urinary tract infection, site not specified: Secondary | ICD-10-CM | POA: Diagnosis not present

## 2018-09-14 DIAGNOSIS — Z7401 Bed confinement status: Secondary | ICD-10-CM | POA: Diagnosis not present

## 2018-09-16 DIAGNOSIS — N183 Chronic kidney disease, stage 3 (moderate): Secondary | ICD-10-CM | POA: Diagnosis not present

## 2018-09-16 DIAGNOSIS — G309 Alzheimer's disease, unspecified: Secondary | ICD-10-CM | POA: Diagnosis not present

## 2018-09-16 DIAGNOSIS — I5030 Unspecified diastolic (congestive) heart failure: Secondary | ICD-10-CM | POA: Diagnosis not present

## 2018-09-16 DIAGNOSIS — S51812D Laceration without foreign body of left forearm, subsequent encounter: Secondary | ICD-10-CM | POA: Diagnosis not present

## 2018-09-16 DIAGNOSIS — I130 Hypertensive heart and chronic kidney disease with heart failure and stage 1 through stage 4 chronic kidney disease, or unspecified chronic kidney disease: Secondary | ICD-10-CM | POA: Diagnosis not present

## 2018-09-16 DIAGNOSIS — F0280 Dementia in other diseases classified elsewhere without behavioral disturbance: Secondary | ICD-10-CM | POA: Diagnosis not present

## 2018-09-20 DIAGNOSIS — F0280 Dementia in other diseases classified elsewhere without behavioral disturbance: Secondary | ICD-10-CM | POA: Diagnosis not present

## 2018-09-20 DIAGNOSIS — G309 Alzheimer's disease, unspecified: Secondary | ICD-10-CM | POA: Diagnosis not present

## 2018-09-20 DIAGNOSIS — S51812D Laceration without foreign body of left forearm, subsequent encounter: Secondary | ICD-10-CM | POA: Diagnosis not present

## 2018-09-20 DIAGNOSIS — I5030 Unspecified diastolic (congestive) heart failure: Secondary | ICD-10-CM | POA: Diagnosis not present

## 2018-09-20 DIAGNOSIS — I130 Hypertensive heart and chronic kidney disease with heart failure and stage 1 through stage 4 chronic kidney disease, or unspecified chronic kidney disease: Secondary | ICD-10-CM | POA: Diagnosis not present

## 2018-09-20 DIAGNOSIS — N183 Chronic kidney disease, stage 3 (moderate): Secondary | ICD-10-CM | POA: Diagnosis not present

## 2018-09-21 DIAGNOSIS — L89891 Pressure ulcer of other site, stage 1: Secondary | ICD-10-CM | POA: Diagnosis not present

## 2018-09-23 DIAGNOSIS — S51812D Laceration without foreign body of left forearm, subsequent encounter: Secondary | ICD-10-CM | POA: Diagnosis not present

## 2018-09-23 DIAGNOSIS — I130 Hypertensive heart and chronic kidney disease with heart failure and stage 1 through stage 4 chronic kidney disease, or unspecified chronic kidney disease: Secondary | ICD-10-CM | POA: Diagnosis not present

## 2018-09-23 DIAGNOSIS — N183 Chronic kidney disease, stage 3 (moderate): Secondary | ICD-10-CM | POA: Diagnosis not present

## 2018-09-23 DIAGNOSIS — F0280 Dementia in other diseases classified elsewhere without behavioral disturbance: Secondary | ICD-10-CM | POA: Diagnosis not present

## 2018-09-23 DIAGNOSIS — G309 Alzheimer's disease, unspecified: Secondary | ICD-10-CM | POA: Diagnosis not present

## 2018-09-23 DIAGNOSIS — M79642 Pain in left hand: Secondary | ICD-10-CM | POA: Diagnosis not present

## 2018-09-23 DIAGNOSIS — I5030 Unspecified diastolic (congestive) heart failure: Secondary | ICD-10-CM | POA: Diagnosis not present

## 2018-09-27 DIAGNOSIS — N183 Chronic kidney disease, stage 3 (moderate): Secondary | ICD-10-CM | POA: Diagnosis not present

## 2018-09-27 DIAGNOSIS — I130 Hypertensive heart and chronic kidney disease with heart failure and stage 1 through stage 4 chronic kidney disease, or unspecified chronic kidney disease: Secondary | ICD-10-CM | POA: Diagnosis not present

## 2018-09-27 DIAGNOSIS — G309 Alzheimer's disease, unspecified: Secondary | ICD-10-CM | POA: Diagnosis not present

## 2018-09-27 DIAGNOSIS — I5030 Unspecified diastolic (congestive) heart failure: Secondary | ICD-10-CM | POA: Diagnosis not present

## 2018-09-27 DIAGNOSIS — F0280 Dementia in other diseases classified elsewhere without behavioral disturbance: Secondary | ICD-10-CM | POA: Diagnosis not present

## 2018-09-27 DIAGNOSIS — S51812D Laceration without foreign body of left forearm, subsequent encounter: Secondary | ICD-10-CM | POA: Diagnosis not present

## 2018-09-28 DIAGNOSIS — G309 Alzheimer's disease, unspecified: Secondary | ICD-10-CM | POA: Diagnosis not present

## 2018-09-28 DIAGNOSIS — Z79899 Other long term (current) drug therapy: Secondary | ICD-10-CM | POA: Diagnosis not present

## 2018-09-29 DIAGNOSIS — S51812D Laceration without foreign body of left forearm, subsequent encounter: Secondary | ICD-10-CM | POA: Diagnosis not present

## 2018-09-29 DIAGNOSIS — G309 Alzheimer's disease, unspecified: Secondary | ICD-10-CM | POA: Diagnosis not present

## 2018-09-29 DIAGNOSIS — I130 Hypertensive heart and chronic kidney disease with heart failure and stage 1 through stage 4 chronic kidney disease, or unspecified chronic kidney disease: Secondary | ICD-10-CM | POA: Diagnosis not present

## 2018-09-29 DIAGNOSIS — F0280 Dementia in other diseases classified elsewhere without behavioral disturbance: Secondary | ICD-10-CM | POA: Diagnosis not present

## 2018-09-29 DIAGNOSIS — I5030 Unspecified diastolic (congestive) heart failure: Secondary | ICD-10-CM | POA: Diagnosis not present

## 2018-09-29 DIAGNOSIS — N183 Chronic kidney disease, stage 3 (moderate): Secondary | ICD-10-CM | POA: Diagnosis not present

## 2018-10-01 DIAGNOSIS — L89891 Pressure ulcer of other site, stage 1: Secondary | ICD-10-CM | POA: Diagnosis not present

## 2018-10-01 DIAGNOSIS — E0580 Other thyrotoxicosis without thyrotoxic crisis or storm: Secondary | ICD-10-CM | POA: Diagnosis not present

## 2018-10-01 DIAGNOSIS — G309 Alzheimer's disease, unspecified: Secondary | ICD-10-CM | POA: Diagnosis not present

## 2018-10-05 DIAGNOSIS — S0180XS Unspecified open wound of other part of head, sequela: Secondary | ICD-10-CM | POA: Diagnosis not present

## 2018-10-05 DIAGNOSIS — R269 Unspecified abnormalities of gait and mobility: Secondary | ICD-10-CM | POA: Diagnosis not present

## 2018-10-05 DIAGNOSIS — G309 Alzheimer's disease, unspecified: Secondary | ICD-10-CM | POA: Diagnosis not present

## 2018-10-06 DIAGNOSIS — F0280 Dementia in other diseases classified elsewhere without behavioral disturbance: Secondary | ICD-10-CM | POA: Diagnosis not present

## 2018-10-06 DIAGNOSIS — S51812D Laceration without foreign body of left forearm, subsequent encounter: Secondary | ICD-10-CM | POA: Diagnosis not present

## 2018-10-06 DIAGNOSIS — I130 Hypertensive heart and chronic kidney disease with heart failure and stage 1 through stage 4 chronic kidney disease, or unspecified chronic kidney disease: Secondary | ICD-10-CM | POA: Diagnosis not present

## 2018-10-06 DIAGNOSIS — I5030 Unspecified diastolic (congestive) heart failure: Secondary | ICD-10-CM | POA: Diagnosis not present

## 2018-10-06 DIAGNOSIS — N183 Chronic kidney disease, stage 3 (moderate): Secondary | ICD-10-CM | POA: Diagnosis not present

## 2018-10-06 DIAGNOSIS — G309 Alzheimer's disease, unspecified: Secondary | ICD-10-CM | POA: Diagnosis not present

## 2018-10-11 DIAGNOSIS — S51812D Laceration without foreign body of left forearm, subsequent encounter: Secondary | ICD-10-CM | POA: Diagnosis not present

## 2018-10-11 DIAGNOSIS — G309 Alzheimer's disease, unspecified: Secondary | ICD-10-CM | POA: Diagnosis not present

## 2018-10-11 DIAGNOSIS — I5030 Unspecified diastolic (congestive) heart failure: Secondary | ICD-10-CM | POA: Diagnosis not present

## 2018-10-11 DIAGNOSIS — F0280 Dementia in other diseases classified elsewhere without behavioral disturbance: Secondary | ICD-10-CM | POA: Diagnosis not present

## 2018-10-11 DIAGNOSIS — N183 Chronic kidney disease, stage 3 (moderate): Secondary | ICD-10-CM | POA: Diagnosis not present

## 2018-10-11 DIAGNOSIS — I130 Hypertensive heart and chronic kidney disease with heart failure and stage 1 through stage 4 chronic kidney disease, or unspecified chronic kidney disease: Secondary | ICD-10-CM | POA: Diagnosis not present

## 2018-10-15 DIAGNOSIS — F0280 Dementia in other diseases classified elsewhere without behavioral disturbance: Secondary | ICD-10-CM | POA: Diagnosis not present

## 2018-10-15 DIAGNOSIS — N183 Chronic kidney disease, stage 3 (moderate): Secondary | ICD-10-CM | POA: Diagnosis not present

## 2018-10-15 DIAGNOSIS — I130 Hypertensive heart and chronic kidney disease with heart failure and stage 1 through stage 4 chronic kidney disease, or unspecified chronic kidney disease: Secondary | ICD-10-CM | POA: Diagnosis not present

## 2018-10-15 DIAGNOSIS — R1312 Dysphagia, oropharyngeal phase: Secondary | ICD-10-CM | POA: Diagnosis not present

## 2018-10-15 DIAGNOSIS — E0590 Thyrotoxicosis, unspecified without thyrotoxic crisis or storm: Secondary | ICD-10-CM | POA: Diagnosis not present

## 2018-10-15 DIAGNOSIS — I5030 Unspecified diastolic (congestive) heart failure: Secondary | ICD-10-CM | POA: Diagnosis not present

## 2018-10-15 DIAGNOSIS — D509 Iron deficiency anemia, unspecified: Secondary | ICD-10-CM | POA: Diagnosis not present

## 2018-10-15 DIAGNOSIS — I4891 Unspecified atrial fibrillation: Secondary | ICD-10-CM | POA: Diagnosis not present

## 2018-10-15 DIAGNOSIS — Z7901 Long term (current) use of anticoagulants: Secondary | ICD-10-CM | POA: Diagnosis not present

## 2018-10-15 DIAGNOSIS — G309 Alzheimer's disease, unspecified: Secondary | ICD-10-CM | POA: Diagnosis not present

## 2018-11-01 DIAGNOSIS — F0280 Dementia in other diseases classified elsewhere without behavioral disturbance: Secondary | ICD-10-CM | POA: Diagnosis not present

## 2018-11-01 DIAGNOSIS — R1312 Dysphagia, oropharyngeal phase: Secondary | ICD-10-CM | POA: Diagnosis not present

## 2018-11-01 DIAGNOSIS — N183 Chronic kidney disease, stage 3 (moderate): Secondary | ICD-10-CM | POA: Diagnosis not present

## 2018-11-01 DIAGNOSIS — I130 Hypertensive heart and chronic kidney disease with heart failure and stage 1 through stage 4 chronic kidney disease, or unspecified chronic kidney disease: Secondary | ICD-10-CM | POA: Diagnosis not present

## 2018-11-01 DIAGNOSIS — I5030 Unspecified diastolic (congestive) heart failure: Secondary | ICD-10-CM | POA: Diagnosis not present

## 2018-11-01 DIAGNOSIS — G309 Alzheimer's disease, unspecified: Secondary | ICD-10-CM | POA: Diagnosis not present

## 2018-11-08 DIAGNOSIS — F0391 Unspecified dementia with behavioral disturbance: Secondary | ICD-10-CM | POA: Diagnosis not present

## 2018-11-10 DIAGNOSIS — I130 Hypertensive heart and chronic kidney disease with heart failure and stage 1 through stage 4 chronic kidney disease, or unspecified chronic kidney disease: Secondary | ICD-10-CM | POA: Diagnosis not present

## 2018-11-10 DIAGNOSIS — N183 Chronic kidney disease, stage 3 (moderate): Secondary | ICD-10-CM | POA: Diagnosis not present

## 2018-11-10 DIAGNOSIS — I5030 Unspecified diastolic (congestive) heart failure: Secondary | ICD-10-CM | POA: Diagnosis not present

## 2018-11-10 DIAGNOSIS — G309 Alzheimer's disease, unspecified: Secondary | ICD-10-CM | POA: Diagnosis not present

## 2018-11-10 DIAGNOSIS — F0280 Dementia in other diseases classified elsewhere without behavioral disturbance: Secondary | ICD-10-CM | POA: Diagnosis not present

## 2018-11-10 DIAGNOSIS — R1312 Dysphagia, oropharyngeal phase: Secondary | ICD-10-CM | POA: Diagnosis not present

## 2018-11-15 DIAGNOSIS — L84 Corns and callosities: Secondary | ICD-10-CM | POA: Diagnosis not present

## 2018-11-15 DIAGNOSIS — I739 Peripheral vascular disease, unspecified: Secondary | ICD-10-CM | POA: Diagnosis not present

## 2018-11-15 DIAGNOSIS — B351 Tinea unguium: Secondary | ICD-10-CM | POA: Diagnosis not present

## 2018-11-15 DIAGNOSIS — R609 Edema, unspecified: Secondary | ICD-10-CM | POA: Diagnosis not present

## 2018-11-16 DIAGNOSIS — G309 Alzheimer's disease, unspecified: Secondary | ICD-10-CM | POA: Diagnosis not present

## 2018-11-16 DIAGNOSIS — H1030 Unspecified acute conjunctivitis, unspecified eye: Secondary | ICD-10-CM | POA: Diagnosis not present

## 2018-11-17 DIAGNOSIS — I130 Hypertensive heart and chronic kidney disease with heart failure and stage 1 through stage 4 chronic kidney disease, or unspecified chronic kidney disease: Secondary | ICD-10-CM | POA: Diagnosis not present

## 2018-11-17 DIAGNOSIS — G309 Alzheimer's disease, unspecified: Secondary | ICD-10-CM | POA: Diagnosis not present

## 2018-11-17 DIAGNOSIS — I5030 Unspecified diastolic (congestive) heart failure: Secondary | ICD-10-CM | POA: Diagnosis not present

## 2018-11-17 DIAGNOSIS — F0280 Dementia in other diseases classified elsewhere without behavioral disturbance: Secondary | ICD-10-CM | POA: Diagnosis not present

## 2018-11-17 DIAGNOSIS — R1312 Dysphagia, oropharyngeal phase: Secondary | ICD-10-CM | POA: Diagnosis not present

## 2018-11-17 DIAGNOSIS — N183 Chronic kidney disease, stage 3 (moderate): Secondary | ICD-10-CM | POA: Diagnosis not present

## 2018-11-23 DIAGNOSIS — R319 Hematuria, unspecified: Secondary | ICD-10-CM | POA: Diagnosis not present

## 2018-11-23 DIAGNOSIS — G309 Alzheimer's disease, unspecified: Secondary | ICD-10-CM | POA: Diagnosis not present

## 2018-11-23 DIAGNOSIS — N390 Urinary tract infection, site not specified: Secondary | ICD-10-CM | POA: Diagnosis not present

## 2018-11-24 DIAGNOSIS — N183 Chronic kidney disease, stage 3 (moderate): Secondary | ICD-10-CM | POA: Diagnosis not present

## 2018-11-24 DIAGNOSIS — I5030 Unspecified diastolic (congestive) heart failure: Secondary | ICD-10-CM | POA: Diagnosis not present

## 2018-11-24 DIAGNOSIS — I130 Hypertensive heart and chronic kidney disease with heart failure and stage 1 through stage 4 chronic kidney disease, or unspecified chronic kidney disease: Secondary | ICD-10-CM | POA: Diagnosis not present

## 2018-11-24 DIAGNOSIS — F0280 Dementia in other diseases classified elsewhere without behavioral disturbance: Secondary | ICD-10-CM | POA: Diagnosis not present

## 2018-11-24 DIAGNOSIS — G309 Alzheimer's disease, unspecified: Secondary | ICD-10-CM | POA: Diagnosis not present

## 2018-11-24 DIAGNOSIS — R1312 Dysphagia, oropharyngeal phase: Secondary | ICD-10-CM | POA: Diagnosis not present

## 2018-11-26 DIAGNOSIS — F0150 Vascular dementia without behavioral disturbance: Secondary | ICD-10-CM | POA: Diagnosis not present

## 2018-11-26 DIAGNOSIS — G309 Alzheimer's disease, unspecified: Secondary | ICD-10-CM | POA: Diagnosis not present

## 2018-11-26 DIAGNOSIS — R319 Hematuria, unspecified: Secondary | ICD-10-CM | POA: Diagnosis not present

## 2018-12-08 DIAGNOSIS — F0391 Unspecified dementia with behavioral disturbance: Secondary | ICD-10-CM | POA: Diagnosis not present

## 2018-12-08 DIAGNOSIS — G309 Alzheimer's disease, unspecified: Secondary | ICD-10-CM | POA: Diagnosis not present

## 2018-12-14 DIAGNOSIS — G309 Alzheimer's disease, unspecified: Secondary | ICD-10-CM | POA: Diagnosis not present

## 2018-12-14 DIAGNOSIS — L89322 Pressure ulcer of left buttock, stage 2: Secondary | ICD-10-CM | POA: Diagnosis not present

## 2018-12-15 DIAGNOSIS — R05 Cough: Secondary | ICD-10-CM | POA: Diagnosis not present

## 2018-12-17 DIAGNOSIS — N183 Chronic kidney disease, stage 3 (moderate): Secondary | ICD-10-CM | POA: Diagnosis not present

## 2018-12-17 DIAGNOSIS — F0280 Dementia in other diseases classified elsewhere without behavioral disturbance: Secondary | ICD-10-CM | POA: Diagnosis not present

## 2018-12-17 DIAGNOSIS — I5030 Unspecified diastolic (congestive) heart failure: Secondary | ICD-10-CM | POA: Diagnosis not present

## 2018-12-17 DIAGNOSIS — E0590 Thyrotoxicosis, unspecified without thyrotoxic crisis or storm: Secondary | ICD-10-CM | POA: Diagnosis not present

## 2018-12-17 DIAGNOSIS — G308 Other Alzheimer's disease: Secondary | ICD-10-CM | POA: Diagnosis not present

## 2018-12-17 DIAGNOSIS — I4891 Unspecified atrial fibrillation: Secondary | ICD-10-CM | POA: Diagnosis not present

## 2018-12-17 DIAGNOSIS — G309 Alzheimer's disease, unspecified: Secondary | ICD-10-CM | POA: Diagnosis not present

## 2018-12-17 DIAGNOSIS — R1312 Dysphagia, oropharyngeal phase: Secondary | ICD-10-CM | POA: Diagnosis not present

## 2018-12-17 DIAGNOSIS — L89322 Pressure ulcer of left buttock, stage 2: Secondary | ICD-10-CM | POA: Diagnosis not present

## 2018-12-17 DIAGNOSIS — I130 Hypertensive heart and chronic kidney disease with heart failure and stage 1 through stage 4 chronic kidney disease, or unspecified chronic kidney disease: Secondary | ICD-10-CM | POA: Diagnosis not present

## 2018-12-17 DIAGNOSIS — D509 Iron deficiency anemia, unspecified: Secondary | ICD-10-CM | POA: Diagnosis not present

## 2018-12-18 DIAGNOSIS — F329 Major depressive disorder, single episode, unspecified: Secondary | ICD-10-CM | POA: Diagnosis present

## 2018-12-18 DIAGNOSIS — I4510 Unspecified right bundle-branch block: Secondary | ICD-10-CM | POA: Diagnosis not present

## 2018-12-18 DIAGNOSIS — I5032 Chronic diastolic (congestive) heart failure: Secondary | ICD-10-CM | POA: Diagnosis present

## 2018-12-18 DIAGNOSIS — B9620 Unspecified Escherichia coli [E. coli] as the cause of diseases classified elsewhere: Secondary | ICD-10-CM | POA: Diagnosis present

## 2018-12-18 DIAGNOSIS — R9431 Abnormal electrocardiogram [ECG] [EKG]: Secondary | ICD-10-CM | POA: Diagnosis not present

## 2018-12-18 DIAGNOSIS — R0602 Shortness of breath: Secondary | ICD-10-CM | POA: Diagnosis not present

## 2018-12-18 DIAGNOSIS — L89222 Pressure ulcer of left hip, stage 2: Secondary | ICD-10-CM | POA: Diagnosis not present

## 2018-12-18 DIAGNOSIS — R000 Tachycardia, unspecified: Secondary | ICD-10-CM | POA: Diagnosis not present

## 2018-12-18 DIAGNOSIS — N3090 Cystitis, unspecified without hematuria: Secondary | ICD-10-CM | POA: Diagnosis not present

## 2018-12-18 DIAGNOSIS — N390 Urinary tract infection, site not specified: Secondary | ICD-10-CM | POA: Diagnosis present

## 2018-12-18 DIAGNOSIS — I130 Hypertensive heart and chronic kidney disease with heart failure and stage 1 through stage 4 chronic kidney disease, or unspecified chronic kidney disease: Secondary | ICD-10-CM | POA: Diagnosis present

## 2018-12-18 DIAGNOSIS — E43 Unspecified severe protein-calorie malnutrition: Secondary | ICD-10-CM | POA: Diagnosis present

## 2018-12-18 DIAGNOSIS — Z66 Do not resuscitate: Secondary | ICD-10-CM | POA: Diagnosis present

## 2018-12-18 DIAGNOSIS — R6520 Severe sepsis without septic shock: Secondary | ICD-10-CM | POA: Diagnosis not present

## 2018-12-18 DIAGNOSIS — E870 Hyperosmolality and hypernatremia: Secondary | ICD-10-CM | POA: Diagnosis present

## 2018-12-18 DIAGNOSIS — F0281 Dementia in other diseases classified elsewhere with behavioral disturbance: Secondary | ICD-10-CM | POA: Diagnosis not present

## 2018-12-18 DIAGNOSIS — E871 Hypo-osmolality and hyponatremia: Secondary | ICD-10-CM | POA: Diagnosis not present

## 2018-12-18 DIAGNOSIS — I4589 Other specified conduction disorders: Secondary | ICD-10-CM | POA: Diagnosis not present

## 2018-12-18 DIAGNOSIS — A419 Sepsis, unspecified organism: Secondary | ICD-10-CM | POA: Diagnosis not present

## 2018-12-18 DIAGNOSIS — R4701 Aphasia: Secondary | ICD-10-CM | POA: Diagnosis present

## 2018-12-18 DIAGNOSIS — Z681 Body mass index (BMI) 19 or less, adult: Secondary | ICD-10-CM | POA: Diagnosis not present

## 2018-12-18 DIAGNOSIS — D72829 Elevated white blood cell count, unspecified: Secondary | ICD-10-CM | POA: Diagnosis not present

## 2018-12-18 DIAGNOSIS — R509 Fever, unspecified: Secondary | ICD-10-CM | POA: Diagnosis not present

## 2018-12-18 DIAGNOSIS — G309 Alzheimer's disease, unspecified: Secondary | ICD-10-CM | POA: Diagnosis present

## 2018-12-18 DIAGNOSIS — E860 Dehydration: Secondary | ICD-10-CM | POA: Diagnosis present

## 2018-12-18 DIAGNOSIS — F0280 Dementia in other diseases classified elsewhere without behavioral disturbance: Secondary | ICD-10-CM | POA: Diagnosis present

## 2018-12-18 DIAGNOSIS — G9341 Metabolic encephalopathy: Secondary | ICD-10-CM | POA: Diagnosis present

## 2018-12-18 DIAGNOSIS — I4892 Unspecified atrial flutter: Secondary | ICD-10-CM | POA: Diagnosis not present

## 2018-12-18 DIAGNOSIS — E039 Hypothyroidism, unspecified: Secondary | ICD-10-CM | POA: Diagnosis present

## 2018-12-18 DIAGNOSIS — Z90710 Acquired absence of both cervix and uterus: Secondary | ICD-10-CM | POA: Diagnosis not present

## 2018-12-18 DIAGNOSIS — F0390 Unspecified dementia without behavioral disturbance: Secondary | ICD-10-CM | POA: Diagnosis not present

## 2018-12-18 DIAGNOSIS — R4182 Altered mental status, unspecified: Secondary | ICD-10-CM | POA: Diagnosis not present

## 2018-12-18 DIAGNOSIS — N183 Chronic kidney disease, stage 3 (moderate): Secondary | ICD-10-CM | POA: Diagnosis present

## 2018-12-18 DIAGNOSIS — I4891 Unspecified atrial fibrillation: Secondary | ICD-10-CM | POA: Diagnosis present

## 2018-12-18 DIAGNOSIS — R1310 Dysphagia, unspecified: Secondary | ICD-10-CM | POA: Diagnosis not present

## 2018-12-24 DIAGNOSIS — N183 Chronic kidney disease, stage 3 (moderate): Secondary | ICD-10-CM | POA: Diagnosis not present

## 2018-12-24 DIAGNOSIS — G309 Alzheimer's disease, unspecified: Secondary | ICD-10-CM | POA: Diagnosis not present

## 2018-12-24 DIAGNOSIS — F0280 Dementia in other diseases classified elsewhere without behavioral disturbance: Secondary | ICD-10-CM | POA: Diagnosis not present

## 2018-12-24 DIAGNOSIS — B958 Unspecified staphylococcus as the cause of diseases classified elsewhere: Secondary | ICD-10-CM | POA: Diagnosis not present

## 2018-12-24 DIAGNOSIS — L89322 Pressure ulcer of left buttock, stage 2: Secondary | ICD-10-CM | POA: Diagnosis not present

## 2018-12-24 DIAGNOSIS — R1310 Dysphagia, unspecified: Secondary | ICD-10-CM | POA: Diagnosis not present

## 2018-12-24 DIAGNOSIS — E860 Dehydration: Secondary | ICD-10-CM | POA: Diagnosis not present

## 2018-12-24 DIAGNOSIS — B9620 Unspecified Escherichia coli [E. coli] as the cause of diseases classified elsewhere: Secondary | ICD-10-CM | POA: Diagnosis not present

## 2018-12-24 DIAGNOSIS — N390 Urinary tract infection, site not specified: Secondary | ICD-10-CM | POA: Diagnosis not present

## 2018-12-28 DIAGNOSIS — L89322 Pressure ulcer of left buttock, stage 2: Secondary | ICD-10-CM | POA: Diagnosis not present

## 2018-12-28 DIAGNOSIS — R1310 Dysphagia, unspecified: Secondary | ICD-10-CM | POA: Diagnosis not present

## 2018-12-28 DIAGNOSIS — B958 Unspecified staphylococcus as the cause of diseases classified elsewhere: Secondary | ICD-10-CM | POA: Diagnosis not present

## 2018-12-28 DIAGNOSIS — B9620 Unspecified Escherichia coli [E. coli] as the cause of diseases classified elsewhere: Secondary | ICD-10-CM | POA: Diagnosis not present

## 2018-12-28 DIAGNOSIS — N390 Urinary tract infection, site not specified: Secondary | ICD-10-CM | POA: Diagnosis not present

## 2018-12-28 DIAGNOSIS — G309 Alzheimer's disease, unspecified: Secondary | ICD-10-CM | POA: Diagnosis not present

## 2018-12-29 DIAGNOSIS — N390 Urinary tract infection, site not specified: Secondary | ICD-10-CM | POA: Diagnosis not present

## 2018-12-29 DIAGNOSIS — E870 Hyperosmolality and hypernatremia: Secondary | ICD-10-CM | POA: Diagnosis not present

## 2018-12-29 DIAGNOSIS — B9620 Unspecified Escherichia coli [E. coli] as the cause of diseases classified elsewhere: Secondary | ICD-10-CM | POA: Diagnosis not present

## 2018-12-29 DIAGNOSIS — R1310 Dysphagia, unspecified: Secondary | ICD-10-CM | POA: Diagnosis not present

## 2018-12-29 DIAGNOSIS — B958 Unspecified staphylococcus as the cause of diseases classified elsewhere: Secondary | ICD-10-CM | POA: Diagnosis not present

## 2018-12-29 DIAGNOSIS — E860 Dehydration: Secondary | ICD-10-CM | POA: Diagnosis not present

## 2018-12-29 DIAGNOSIS — L89322 Pressure ulcer of left buttock, stage 2: Secondary | ICD-10-CM | POA: Diagnosis not present

## 2018-12-29 DIAGNOSIS — G309 Alzheimer's disease, unspecified: Secondary | ICD-10-CM | POA: Diagnosis not present

## 2018-12-30 DIAGNOSIS — A498 Other bacterial infections of unspecified site: Secondary | ICD-10-CM | POA: Diagnosis not present

## 2018-12-30 DIAGNOSIS — Z452 Encounter for adjustment and management of vascular access device: Secondary | ICD-10-CM | POA: Diagnosis not present

## 2018-12-30 DIAGNOSIS — E860 Dehydration: Secondary | ICD-10-CM | POA: Diagnosis not present

## 2018-12-31 DIAGNOSIS — L89322 Pressure ulcer of left buttock, stage 2: Secondary | ICD-10-CM | POA: Diagnosis not present

## 2018-12-31 DIAGNOSIS — B958 Unspecified staphylococcus as the cause of diseases classified elsewhere: Secondary | ICD-10-CM | POA: Diagnosis not present

## 2018-12-31 DIAGNOSIS — R1310 Dysphagia, unspecified: Secondary | ICD-10-CM | POA: Diagnosis not present

## 2018-12-31 DIAGNOSIS — L89891 Pressure ulcer of other site, stage 1: Secondary | ICD-10-CM | POA: Diagnosis not present

## 2018-12-31 DIAGNOSIS — Z79899 Other long term (current) drug therapy: Secondary | ICD-10-CM | POA: Diagnosis not present

## 2018-12-31 DIAGNOSIS — E0580 Other thyrotoxicosis without thyrotoxic crisis or storm: Secondary | ICD-10-CM | POA: Diagnosis not present

## 2018-12-31 DIAGNOSIS — B9620 Unspecified Escherichia coli [E. coli] as the cause of diseases classified elsewhere: Secondary | ICD-10-CM | POA: Diagnosis not present

## 2018-12-31 DIAGNOSIS — G309 Alzheimer's disease, unspecified: Secondary | ICD-10-CM | POA: Diagnosis not present

## 2018-12-31 DIAGNOSIS — F0391 Unspecified dementia with behavioral disturbance: Secondary | ICD-10-CM | POA: Diagnosis not present

## 2018-12-31 DIAGNOSIS — N390 Urinary tract infection, site not specified: Secondary | ICD-10-CM | POA: Diagnosis not present

## 2019-01-03 DIAGNOSIS — B9620 Unspecified Escherichia coli [E. coli] as the cause of diseases classified elsewhere: Secondary | ICD-10-CM | POA: Diagnosis not present

## 2019-01-03 DIAGNOSIS — N390 Urinary tract infection, site not specified: Secondary | ICD-10-CM | POA: Diagnosis not present

## 2019-01-03 DIAGNOSIS — B958 Unspecified staphylococcus as the cause of diseases classified elsewhere: Secondary | ICD-10-CM | POA: Diagnosis not present

## 2019-01-03 DIAGNOSIS — L89322 Pressure ulcer of left buttock, stage 2: Secondary | ICD-10-CM | POA: Diagnosis not present

## 2019-01-03 DIAGNOSIS — G309 Alzheimer's disease, unspecified: Secondary | ICD-10-CM | POA: Diagnosis not present

## 2019-01-03 DIAGNOSIS — R1310 Dysphagia, unspecified: Secondary | ICD-10-CM | POA: Diagnosis not present

## 2019-01-04 DIAGNOSIS — G309 Alzheimer's disease, unspecified: Secondary | ICD-10-CM | POA: Diagnosis not present

## 2019-01-04 DIAGNOSIS — R1310 Dysphagia, unspecified: Secondary | ICD-10-CM | POA: Diagnosis not present

## 2019-01-04 DIAGNOSIS — L89152 Pressure ulcer of sacral region, stage 2: Secondary | ICD-10-CM | POA: Diagnosis not present

## 2019-01-04 DIAGNOSIS — B9620 Unspecified Escherichia coli [E. coli] as the cause of diseases classified elsewhere: Secondary | ICD-10-CM | POA: Diagnosis not present

## 2019-01-04 DIAGNOSIS — L89322 Pressure ulcer of left buttock, stage 2: Secondary | ICD-10-CM | POA: Diagnosis not present

## 2019-01-04 DIAGNOSIS — B958 Unspecified staphylococcus as the cause of diseases classified elsewhere: Secondary | ICD-10-CM | POA: Diagnosis not present

## 2019-01-04 DIAGNOSIS — N390 Urinary tract infection, site not specified: Secondary | ICD-10-CM | POA: Diagnosis not present

## 2019-01-06 DIAGNOSIS — F0391 Unspecified dementia with behavioral disturbance: Secondary | ICD-10-CM | POA: Diagnosis not present

## 2019-01-10 DIAGNOSIS — L89322 Pressure ulcer of left buttock, stage 2: Secondary | ICD-10-CM | POA: Diagnosis not present

## 2019-01-10 DIAGNOSIS — G309 Alzheimer's disease, unspecified: Secondary | ICD-10-CM | POA: Diagnosis not present

## 2019-01-10 DIAGNOSIS — N390 Urinary tract infection, site not specified: Secondary | ICD-10-CM | POA: Diagnosis not present

## 2019-01-10 DIAGNOSIS — B958 Unspecified staphylococcus as the cause of diseases classified elsewhere: Secondary | ICD-10-CM | POA: Diagnosis not present

## 2019-01-10 DIAGNOSIS — R1310 Dysphagia, unspecified: Secondary | ICD-10-CM | POA: Diagnosis not present

## 2019-01-10 DIAGNOSIS — B9620 Unspecified Escherichia coli [E. coli] as the cause of diseases classified elsewhere: Secondary | ICD-10-CM | POA: Diagnosis not present

## 2019-01-11 DIAGNOSIS — E860 Dehydration: Secondary | ICD-10-CM | POA: Diagnosis not present

## 2019-01-11 DIAGNOSIS — G309 Alzheimer's disease, unspecified: Secondary | ICD-10-CM | POA: Diagnosis not present

## 2019-01-12 DIAGNOSIS — Z9981 Dependence on supplemental oxygen: Secondary | ICD-10-CM | POA: Diagnosis not present

## 2019-01-12 DIAGNOSIS — I5030 Unspecified diastolic (congestive) heart failure: Secondary | ICD-10-CM | POA: Diagnosis not present

## 2019-01-12 DIAGNOSIS — F0280 Dementia in other diseases classified elsewhere without behavioral disturbance: Secondary | ICD-10-CM | POA: Diagnosis not present

## 2019-01-12 DIAGNOSIS — I130 Hypertensive heart and chronic kidney disease with heart failure and stage 1 through stage 4 chronic kidney disease, or unspecified chronic kidney disease: Secondary | ICD-10-CM | POA: Diagnosis not present

## 2019-01-12 DIAGNOSIS — I4891 Unspecified atrial fibrillation: Secondary | ICD-10-CM | POA: Diagnosis not present

## 2019-01-12 DIAGNOSIS — L89150 Pressure ulcer of sacral region, unstageable: Secondary | ICD-10-CM | POA: Diagnosis not present

## 2019-01-12 DIAGNOSIS — N183 Chronic kidney disease, stage 3 (moderate): Secondary | ICD-10-CM | POA: Diagnosis not present

## 2019-01-12 DIAGNOSIS — Z452 Encounter for adjustment and management of vascular access device: Secondary | ICD-10-CM | POA: Diagnosis not present

## 2019-01-12 DIAGNOSIS — Z7901 Long term (current) use of anticoagulants: Secondary | ICD-10-CM | POA: Diagnosis not present

## 2019-01-12 DIAGNOSIS — D509 Iron deficiency anemia, unspecified: Secondary | ICD-10-CM | POA: Diagnosis not present

## 2019-01-12 DIAGNOSIS — G309 Alzheimer's disease, unspecified: Secondary | ICD-10-CM | POA: Diagnosis not present

## 2019-01-12 DIAGNOSIS — L89322 Pressure ulcer of left buttock, stage 2: Secondary | ICD-10-CM | POA: Diagnosis not present

## 2019-01-17 DIAGNOSIS — G309 Alzheimer's disease, unspecified: Secondary | ICD-10-CM | POA: Diagnosis not present

## 2019-01-17 DIAGNOSIS — I130 Hypertensive heart and chronic kidney disease with heart failure and stage 1 through stage 4 chronic kidney disease, or unspecified chronic kidney disease: Secondary | ICD-10-CM | POA: Diagnosis not present

## 2019-01-17 DIAGNOSIS — L89150 Pressure ulcer of sacral region, unstageable: Secondary | ICD-10-CM | POA: Diagnosis not present

## 2019-01-17 DIAGNOSIS — Z452 Encounter for adjustment and management of vascular access device: Secondary | ICD-10-CM | POA: Diagnosis not present

## 2019-01-17 DIAGNOSIS — F0280 Dementia in other diseases classified elsewhere without behavioral disturbance: Secondary | ICD-10-CM | POA: Diagnosis not present

## 2019-01-17 DIAGNOSIS — L89322 Pressure ulcer of left buttock, stage 2: Secondary | ICD-10-CM | POA: Diagnosis not present

## 2019-01-24 DIAGNOSIS — G309 Alzheimer's disease, unspecified: Secondary | ICD-10-CM | POA: Diagnosis not present

## 2019-01-24 DIAGNOSIS — F0280 Dementia in other diseases classified elsewhere without behavioral disturbance: Secondary | ICD-10-CM | POA: Diagnosis not present

## 2019-01-24 DIAGNOSIS — I130 Hypertensive heart and chronic kidney disease with heart failure and stage 1 through stage 4 chronic kidney disease, or unspecified chronic kidney disease: Secondary | ICD-10-CM | POA: Diagnosis not present

## 2019-01-24 DIAGNOSIS — Z452 Encounter for adjustment and management of vascular access device: Secondary | ICD-10-CM | POA: Diagnosis not present

## 2019-01-24 DIAGNOSIS — L89322 Pressure ulcer of left buttock, stage 2: Secondary | ICD-10-CM | POA: Diagnosis not present

## 2019-01-24 DIAGNOSIS — L89150 Pressure ulcer of sacral region, unstageable: Secondary | ICD-10-CM | POA: Diagnosis not present

## 2019-01-25 DIAGNOSIS — E860 Dehydration: Secondary | ICD-10-CM | POA: Diagnosis not present

## 2019-01-25 DIAGNOSIS — F0150 Vascular dementia without behavioral disturbance: Secondary | ICD-10-CM | POA: Diagnosis not present

## 2019-01-31 DIAGNOSIS — F0280 Dementia in other diseases classified elsewhere without behavioral disturbance: Secondary | ICD-10-CM | POA: Diagnosis not present

## 2019-01-31 DIAGNOSIS — L89322 Pressure ulcer of left buttock, stage 2: Secondary | ICD-10-CM | POA: Diagnosis not present

## 2019-01-31 DIAGNOSIS — Z452 Encounter for adjustment and management of vascular access device: Secondary | ICD-10-CM | POA: Diagnosis not present

## 2019-01-31 DIAGNOSIS — G309 Alzheimer's disease, unspecified: Secondary | ICD-10-CM | POA: Diagnosis not present

## 2019-01-31 DIAGNOSIS — L89150 Pressure ulcer of sacral region, unstageable: Secondary | ICD-10-CM | POA: Diagnosis not present

## 2019-01-31 DIAGNOSIS — I130 Hypertensive heart and chronic kidney disease with heart failure and stage 1 through stage 4 chronic kidney disease, or unspecified chronic kidney disease: Secondary | ICD-10-CM | POA: Diagnosis not present

## 2019-02-02 DIAGNOSIS — F0391 Unspecified dementia with behavioral disturbance: Secondary | ICD-10-CM | POA: Diagnosis not present

## 2019-02-07 DIAGNOSIS — F0280 Dementia in other diseases classified elsewhere without behavioral disturbance: Secondary | ICD-10-CM | POA: Diagnosis not present

## 2019-02-07 DIAGNOSIS — Z452 Encounter for adjustment and management of vascular access device: Secondary | ICD-10-CM | POA: Diagnosis not present

## 2019-02-07 DIAGNOSIS — I130 Hypertensive heart and chronic kidney disease with heart failure and stage 1 through stage 4 chronic kidney disease, or unspecified chronic kidney disease: Secondary | ICD-10-CM | POA: Diagnosis not present

## 2019-02-07 DIAGNOSIS — G309 Alzheimer's disease, unspecified: Secondary | ICD-10-CM | POA: Diagnosis not present

## 2019-02-07 DIAGNOSIS — L89150 Pressure ulcer of sacral region, unstageable: Secondary | ICD-10-CM | POA: Diagnosis not present

## 2019-02-07 DIAGNOSIS — L89322 Pressure ulcer of left buttock, stage 2: Secondary | ICD-10-CM | POA: Diagnosis not present

## 2019-02-11 DIAGNOSIS — I130 Hypertensive heart and chronic kidney disease with heart failure and stage 1 through stage 4 chronic kidney disease, or unspecified chronic kidney disease: Secondary | ICD-10-CM | POA: Diagnosis not present

## 2019-02-11 DIAGNOSIS — I5030 Unspecified diastolic (congestive) heart failure: Secondary | ICD-10-CM | POA: Diagnosis not present

## 2019-02-11 DIAGNOSIS — F0280 Dementia in other diseases classified elsewhere without behavioral disturbance: Secondary | ICD-10-CM | POA: Diagnosis not present

## 2019-02-11 DIAGNOSIS — Z9981 Dependence on supplemental oxygen: Secondary | ICD-10-CM | POA: Diagnosis not present

## 2019-02-11 DIAGNOSIS — L89150 Pressure ulcer of sacral region, unstageable: Secondary | ICD-10-CM | POA: Diagnosis not present

## 2019-02-11 DIAGNOSIS — Z7901 Long term (current) use of anticoagulants: Secondary | ICD-10-CM | POA: Diagnosis not present

## 2019-02-11 DIAGNOSIS — G309 Alzheimer's disease, unspecified: Secondary | ICD-10-CM | POA: Diagnosis not present

## 2019-02-11 DIAGNOSIS — L89322 Pressure ulcer of left buttock, stage 2: Secondary | ICD-10-CM | POA: Diagnosis not present

## 2019-02-11 DIAGNOSIS — N183 Chronic kidney disease, stage 3 (moderate): Secondary | ICD-10-CM | POA: Diagnosis not present

## 2019-02-11 DIAGNOSIS — D509 Iron deficiency anemia, unspecified: Secondary | ICD-10-CM | POA: Diagnosis not present

## 2019-02-11 DIAGNOSIS — I4891 Unspecified atrial fibrillation: Secondary | ICD-10-CM | POA: Diagnosis not present

## 2019-02-11 DIAGNOSIS — Z452 Encounter for adjustment and management of vascular access device: Secondary | ICD-10-CM | POA: Diagnosis not present

## 2019-02-15 DIAGNOSIS — L89322 Pressure ulcer of left buttock, stage 2: Secondary | ICD-10-CM | POA: Diagnosis not present

## 2019-02-15 DIAGNOSIS — L89150 Pressure ulcer of sacral region, unstageable: Secondary | ICD-10-CM | POA: Diagnosis not present

## 2019-02-15 DIAGNOSIS — G309 Alzheimer's disease, unspecified: Secondary | ICD-10-CM | POA: Diagnosis not present

## 2019-02-15 DIAGNOSIS — E860 Dehydration: Secondary | ICD-10-CM | POA: Diagnosis not present

## 2019-02-15 DIAGNOSIS — F0280 Dementia in other diseases classified elsewhere without behavioral disturbance: Secondary | ICD-10-CM | POA: Diagnosis not present

## 2019-02-15 DIAGNOSIS — Z452 Encounter for adjustment and management of vascular access device: Secondary | ICD-10-CM | POA: Diagnosis not present

## 2019-02-15 DIAGNOSIS — I130 Hypertensive heart and chronic kidney disease with heart failure and stage 1 through stage 4 chronic kidney disease, or unspecified chronic kidney disease: Secondary | ICD-10-CM | POA: Diagnosis not present

## 2019-02-21 DIAGNOSIS — Z452 Encounter for adjustment and management of vascular access device: Secondary | ICD-10-CM | POA: Diagnosis not present

## 2019-02-21 DIAGNOSIS — F0280 Dementia in other diseases classified elsewhere without behavioral disturbance: Secondary | ICD-10-CM | POA: Diagnosis not present

## 2019-02-21 DIAGNOSIS — G309 Alzheimer's disease, unspecified: Secondary | ICD-10-CM | POA: Diagnosis not present

## 2019-02-21 DIAGNOSIS — L89322 Pressure ulcer of left buttock, stage 2: Secondary | ICD-10-CM | POA: Diagnosis not present

## 2019-02-21 DIAGNOSIS — L89150 Pressure ulcer of sacral region, unstageable: Secondary | ICD-10-CM | POA: Diagnosis not present

## 2019-02-21 DIAGNOSIS — I130 Hypertensive heart and chronic kidney disease with heart failure and stage 1 through stage 4 chronic kidney disease, or unspecified chronic kidney disease: Secondary | ICD-10-CM | POA: Diagnosis not present

## 2019-02-28 DIAGNOSIS — F0280 Dementia in other diseases classified elsewhere without behavioral disturbance: Secondary | ICD-10-CM | POA: Diagnosis not present

## 2019-02-28 DIAGNOSIS — Z452 Encounter for adjustment and management of vascular access device: Secondary | ICD-10-CM | POA: Diagnosis not present

## 2019-02-28 DIAGNOSIS — L89322 Pressure ulcer of left buttock, stage 2: Secondary | ICD-10-CM | POA: Diagnosis not present

## 2019-02-28 DIAGNOSIS — I130 Hypertensive heart and chronic kidney disease with heart failure and stage 1 through stage 4 chronic kidney disease, or unspecified chronic kidney disease: Secondary | ICD-10-CM | POA: Diagnosis not present

## 2019-02-28 DIAGNOSIS — G309 Alzheimer's disease, unspecified: Secondary | ICD-10-CM | POA: Diagnosis not present

## 2019-02-28 DIAGNOSIS — L89150 Pressure ulcer of sacral region, unstageable: Secondary | ICD-10-CM | POA: Diagnosis not present

## 2019-03-01 DIAGNOSIS — Z993 Dependence on wheelchair: Secondary | ICD-10-CM | POA: Diagnosis not present

## 2019-03-01 DIAGNOSIS — G309 Alzheimer's disease, unspecified: Secondary | ICD-10-CM | POA: Diagnosis not present

## 2019-03-01 DIAGNOSIS — E860 Dehydration: Secondary | ICD-10-CM | POA: Diagnosis not present

## 2019-03-07 DIAGNOSIS — F0391 Unspecified dementia with behavioral disturbance: Secondary | ICD-10-CM | POA: Diagnosis not present

## 2019-03-08 DIAGNOSIS — Z993 Dependence on wheelchair: Secondary | ICD-10-CM | POA: Diagnosis not present

## 2019-03-08 DIAGNOSIS — G309 Alzheimer's disease, unspecified: Secondary | ICD-10-CM | POA: Diagnosis not present

## 2019-03-10 DIAGNOSIS — I130 Hypertensive heart and chronic kidney disease with heart failure and stage 1 through stage 4 chronic kidney disease, or unspecified chronic kidney disease: Secondary | ICD-10-CM | POA: Diagnosis not present

## 2019-03-10 DIAGNOSIS — F0280 Dementia in other diseases classified elsewhere without behavioral disturbance: Secondary | ICD-10-CM | POA: Diagnosis not present

## 2019-03-10 DIAGNOSIS — Z9181 History of falling: Secondary | ICD-10-CM | POA: Diagnosis not present

## 2019-03-10 DIAGNOSIS — Z7901 Long term (current) use of anticoagulants: Secondary | ICD-10-CM | POA: Diagnosis not present

## 2019-03-10 DIAGNOSIS — E0590 Thyrotoxicosis, unspecified without thyrotoxic crisis or storm: Secondary | ICD-10-CM | POA: Diagnosis not present

## 2019-03-10 DIAGNOSIS — G309 Alzheimer's disease, unspecified: Secondary | ICD-10-CM | POA: Diagnosis not present

## 2019-03-10 DIAGNOSIS — N183 Chronic kidney disease, stage 3 (moderate): Secondary | ICD-10-CM | POA: Diagnosis not present

## 2019-03-10 DIAGNOSIS — I5030 Unspecified diastolic (congestive) heart failure: Secondary | ICD-10-CM | POA: Diagnosis not present

## 2019-03-10 DIAGNOSIS — G4763 Sleep related bruxism: Secondary | ICD-10-CM | POA: Diagnosis not present

## 2019-03-10 DIAGNOSIS — I4891 Unspecified atrial fibrillation: Secondary | ICD-10-CM | POA: Diagnosis not present

## 2019-03-10 DIAGNOSIS — D509 Iron deficiency anemia, unspecified: Secondary | ICD-10-CM | POA: Diagnosis not present

## 2019-03-11 DIAGNOSIS — I5030 Unspecified diastolic (congestive) heart failure: Secondary | ICD-10-CM | POA: Diagnosis not present

## 2019-03-11 DIAGNOSIS — I130 Hypertensive heart and chronic kidney disease with heart failure and stage 1 through stage 4 chronic kidney disease, or unspecified chronic kidney disease: Secondary | ICD-10-CM | POA: Diagnosis not present

## 2019-03-11 DIAGNOSIS — N183 Chronic kidney disease, stage 3 (moderate): Secondary | ICD-10-CM | POA: Diagnosis not present

## 2019-03-11 DIAGNOSIS — F0280 Dementia in other diseases classified elsewhere without behavioral disturbance: Secondary | ICD-10-CM | POA: Diagnosis not present

## 2019-03-11 DIAGNOSIS — G309 Alzheimer's disease, unspecified: Secondary | ICD-10-CM | POA: Diagnosis not present

## 2019-03-11 DIAGNOSIS — G4763 Sleep related bruxism: Secondary | ICD-10-CM | POA: Diagnosis not present

## 2019-03-14 DIAGNOSIS — F0280 Dementia in other diseases classified elsewhere without behavioral disturbance: Secondary | ICD-10-CM | POA: Diagnosis not present

## 2019-03-14 DIAGNOSIS — G4763 Sleep related bruxism: Secondary | ICD-10-CM | POA: Diagnosis not present

## 2019-03-14 DIAGNOSIS — I5030 Unspecified diastolic (congestive) heart failure: Secondary | ICD-10-CM | POA: Diagnosis not present

## 2019-03-14 DIAGNOSIS — G309 Alzheimer's disease, unspecified: Secondary | ICD-10-CM | POA: Diagnosis not present

## 2019-03-14 DIAGNOSIS — I130 Hypertensive heart and chronic kidney disease with heart failure and stage 1 through stage 4 chronic kidney disease, or unspecified chronic kidney disease: Secondary | ICD-10-CM | POA: Diagnosis not present

## 2019-03-14 DIAGNOSIS — N183 Chronic kidney disease, stage 3 (moderate): Secondary | ICD-10-CM | POA: Diagnosis not present

## 2019-03-17 DIAGNOSIS — N183 Chronic kidney disease, stage 3 (moderate): Secondary | ICD-10-CM | POA: Diagnosis not present

## 2019-03-17 DIAGNOSIS — I5030 Unspecified diastolic (congestive) heart failure: Secondary | ICD-10-CM | POA: Diagnosis not present

## 2019-03-17 DIAGNOSIS — F0280 Dementia in other diseases classified elsewhere without behavioral disturbance: Secondary | ICD-10-CM | POA: Diagnosis not present

## 2019-03-17 DIAGNOSIS — G4763 Sleep related bruxism: Secondary | ICD-10-CM | POA: Diagnosis not present

## 2019-03-17 DIAGNOSIS — G309 Alzheimer's disease, unspecified: Secondary | ICD-10-CM | POA: Diagnosis not present

## 2019-03-17 DIAGNOSIS — I130 Hypertensive heart and chronic kidney disease with heart failure and stage 1 through stage 4 chronic kidney disease, or unspecified chronic kidney disease: Secondary | ICD-10-CM | POA: Diagnosis not present

## 2019-03-22 DIAGNOSIS — I130 Hypertensive heart and chronic kidney disease with heart failure and stage 1 through stage 4 chronic kidney disease, or unspecified chronic kidney disease: Secondary | ICD-10-CM | POA: Diagnosis not present

## 2019-03-22 DIAGNOSIS — E860 Dehydration: Secondary | ICD-10-CM | POA: Diagnosis not present

## 2019-03-22 DIAGNOSIS — G4763 Sleep related bruxism: Secondary | ICD-10-CM | POA: Diagnosis not present

## 2019-03-22 DIAGNOSIS — G309 Alzheimer's disease, unspecified: Secondary | ICD-10-CM | POA: Diagnosis not present

## 2019-03-22 DIAGNOSIS — N183 Chronic kidney disease, stage 3 (moderate): Secondary | ICD-10-CM | POA: Diagnosis not present

## 2019-03-22 DIAGNOSIS — I5030 Unspecified diastolic (congestive) heart failure: Secondary | ICD-10-CM | POA: Diagnosis not present

## 2019-03-22 DIAGNOSIS — F0280 Dementia in other diseases classified elsewhere without behavioral disturbance: Secondary | ICD-10-CM | POA: Diagnosis not present

## 2019-03-24 DIAGNOSIS — G4763 Sleep related bruxism: Secondary | ICD-10-CM | POA: Diagnosis not present

## 2019-03-24 DIAGNOSIS — F0280 Dementia in other diseases classified elsewhere without behavioral disturbance: Secondary | ICD-10-CM | POA: Diagnosis not present

## 2019-03-24 DIAGNOSIS — N183 Chronic kidney disease, stage 3 (moderate): Secondary | ICD-10-CM | POA: Diagnosis not present

## 2019-03-24 DIAGNOSIS — I739 Peripheral vascular disease, unspecified: Secondary | ICD-10-CM | POA: Diagnosis not present

## 2019-03-24 DIAGNOSIS — I130 Hypertensive heart and chronic kidney disease with heart failure and stage 1 through stage 4 chronic kidney disease, or unspecified chronic kidney disease: Secondary | ICD-10-CM | POA: Diagnosis not present

## 2019-03-24 DIAGNOSIS — G309 Alzheimer's disease, unspecified: Secondary | ICD-10-CM | POA: Diagnosis not present

## 2019-03-24 DIAGNOSIS — I5030 Unspecified diastolic (congestive) heart failure: Secondary | ICD-10-CM | POA: Diagnosis not present

## 2019-03-24 DIAGNOSIS — B351 Tinea unguium: Secondary | ICD-10-CM | POA: Diagnosis not present

## 2019-03-25 DIAGNOSIS — Z79899 Other long term (current) drug therapy: Secondary | ICD-10-CM | POA: Diagnosis not present

## 2019-03-29 DIAGNOSIS — E860 Dehydration: Secondary | ICD-10-CM | POA: Diagnosis not present

## 2019-03-30 DIAGNOSIS — G309 Alzheimer's disease, unspecified: Secondary | ICD-10-CM | POA: Diagnosis not present

## 2019-03-30 DIAGNOSIS — G4763 Sleep related bruxism: Secondary | ICD-10-CM | POA: Diagnosis not present

## 2019-03-30 DIAGNOSIS — N183 Chronic kidney disease, stage 3 (moderate): Secondary | ICD-10-CM | POA: Diagnosis not present

## 2019-03-30 DIAGNOSIS — I5030 Unspecified diastolic (congestive) heart failure: Secondary | ICD-10-CM | POA: Diagnosis not present

## 2019-03-30 DIAGNOSIS — F0280 Dementia in other diseases classified elsewhere without behavioral disturbance: Secondary | ICD-10-CM | POA: Diagnosis not present

## 2019-03-30 DIAGNOSIS — I130 Hypertensive heart and chronic kidney disease with heart failure and stage 1 through stage 4 chronic kidney disease, or unspecified chronic kidney disease: Secondary | ICD-10-CM | POA: Diagnosis not present

## 2019-04-01 DIAGNOSIS — I130 Hypertensive heart and chronic kidney disease with heart failure and stage 1 through stage 4 chronic kidney disease, or unspecified chronic kidney disease: Secondary | ICD-10-CM | POA: Diagnosis not present

## 2019-04-01 DIAGNOSIS — N183 Chronic kidney disease, stage 3 (moderate): Secondary | ICD-10-CM | POA: Diagnosis not present

## 2019-04-01 DIAGNOSIS — I5030 Unspecified diastolic (congestive) heart failure: Secondary | ICD-10-CM | POA: Diagnosis not present

## 2019-04-01 DIAGNOSIS — G309 Alzheimer's disease, unspecified: Secondary | ICD-10-CM | POA: Diagnosis not present

## 2019-04-01 DIAGNOSIS — G4763 Sleep related bruxism: Secondary | ICD-10-CM | POA: Diagnosis not present

## 2019-04-01 DIAGNOSIS — F0280 Dementia in other diseases classified elsewhere without behavioral disturbance: Secondary | ICD-10-CM | POA: Diagnosis not present

## 2019-04-05 DIAGNOSIS — G309 Alzheimer's disease, unspecified: Secondary | ICD-10-CM | POA: Diagnosis not present

## 2019-04-12 DIAGNOSIS — E860 Dehydration: Secondary | ICD-10-CM | POA: Diagnosis not present

## 2019-04-12 DIAGNOSIS — G309 Alzheimer's disease, unspecified: Secondary | ICD-10-CM | POA: Diagnosis not present

## 2019-04-12 DIAGNOSIS — Z993 Dependence on wheelchair: Secondary | ICD-10-CM | POA: Diagnosis not present

## 2019-04-14 DIAGNOSIS — Z01812 Encounter for preprocedural laboratory examination: Secondary | ICD-10-CM | POA: Diagnosis not present

## 2019-04-14 DIAGNOSIS — T8249XA Other complication of vascular dialysis catheter, initial encounter: Secondary | ICD-10-CM | POA: Diagnosis not present

## 2019-04-14 DIAGNOSIS — T82898A Other specified complication of vascular prosthetic devices, implants and grafts, initial encounter: Secondary | ICD-10-CM | POA: Diagnosis not present

## 2019-04-14 DIAGNOSIS — Z01818 Encounter for other preprocedural examination: Secondary | ICD-10-CM | POA: Diagnosis not present

## 2019-04-14 DIAGNOSIS — Z1159 Encounter for screening for other viral diseases: Secondary | ICD-10-CM | POA: Diagnosis not present

## 2019-04-26 DIAGNOSIS — F0150 Vascular dementia without behavioral disturbance: Secondary | ICD-10-CM | POA: Diagnosis not present

## 2019-04-26 DIAGNOSIS — E0580 Other thyrotoxicosis without thyrotoxic crisis or storm: Secondary | ICD-10-CM | POA: Diagnosis not present

## 2019-04-26 DIAGNOSIS — E860 Dehydration: Secondary | ICD-10-CM | POA: Diagnosis not present

## 2019-04-26 DIAGNOSIS — I739 Peripheral vascular disease, unspecified: Secondary | ICD-10-CM | POA: Diagnosis not present

## 2019-04-27 DIAGNOSIS — R05 Cough: Secondary | ICD-10-CM | POA: Diagnosis not present

## 2019-05-03 DIAGNOSIS — G309 Alzheimer's disease, unspecified: Secondary | ICD-10-CM | POA: Diagnosis not present

## 2019-05-03 DIAGNOSIS — E0580 Other thyrotoxicosis without thyrotoxic crisis or storm: Secondary | ICD-10-CM | POA: Diagnosis not present

## 2019-05-03 DIAGNOSIS — D508 Other iron deficiency anemias: Secondary | ICD-10-CM | POA: Diagnosis not present

## 2019-05-03 DIAGNOSIS — E860 Dehydration: Secondary | ICD-10-CM | POA: Diagnosis not present

## 2019-05-06 DIAGNOSIS — N390 Urinary tract infection, site not specified: Secondary | ICD-10-CM | POA: Diagnosis not present

## 2019-05-06 DIAGNOSIS — Z79899 Other long term (current) drug therapy: Secondary | ICD-10-CM | POA: Diagnosis not present

## 2019-05-10 DIAGNOSIS — Z20828 Contact with and (suspected) exposure to other viral communicable diseases: Secondary | ICD-10-CM | POA: Diagnosis not present

## 2019-05-11 DIAGNOSIS — R05 Cough: Secondary | ICD-10-CM | POA: Diagnosis not present

## 2019-05-11 DIAGNOSIS — Z03818 Encounter for observation for suspected exposure to other biological agents ruled out: Secondary | ICD-10-CM | POA: Diagnosis not present

## 2019-05-19 DIAGNOSIS — E0580 Other thyrotoxicosis without thyrotoxic crisis or storm: Secondary | ICD-10-CM | POA: Diagnosis not present

## 2019-05-19 DIAGNOSIS — Z79899 Other long term (current) drug therapy: Secondary | ICD-10-CM | POA: Diagnosis not present

## 2019-05-26 DIAGNOSIS — B351 Tinea unguium: Secondary | ICD-10-CM | POA: Diagnosis not present

## 2019-05-26 DIAGNOSIS — I739 Peripheral vascular disease, unspecified: Secondary | ICD-10-CM | POA: Diagnosis not present

## 2019-06-23 DIAGNOSIS — F0391 Unspecified dementia with behavioral disturbance: Secondary | ICD-10-CM | POA: Diagnosis not present

## 2019-07-15 DIAGNOSIS — Z20828 Contact with and (suspected) exposure to other viral communicable diseases: Secondary | ICD-10-CM | POA: Diagnosis not present

## 2019-07-28 DIAGNOSIS — F0391 Unspecified dementia with behavioral disturbance: Secondary | ICD-10-CM | POA: Diagnosis not present

## 2019-08-01 DIAGNOSIS — R222 Localized swelling, mass and lump, trunk: Secondary | ICD-10-CM | POA: Diagnosis not present

## 2019-08-01 DIAGNOSIS — R079 Chest pain, unspecified: Secondary | ICD-10-CM | POA: Diagnosis not present

## 2019-08-03 ENCOUNTER — Telehealth: Payer: Self-pay | Admitting: Cardiology

## 2019-08-03 DIAGNOSIS — R410 Disorientation, unspecified: Secondary | ICD-10-CM | POA: Diagnosis not present

## 2019-08-03 DIAGNOSIS — R600 Localized edema: Secondary | ICD-10-CM | POA: Diagnosis not present

## 2019-08-03 DIAGNOSIS — S20222A Contusion of left back wall of thorax, initial encounter: Secondary | ICD-10-CM | POA: Diagnosis not present

## 2019-08-03 DIAGNOSIS — Z741 Need for assistance with personal care: Secondary | ICD-10-CM | POA: Diagnosis not present

## 2019-08-03 NOTE — Telephone Encounter (Signed)
Called to speak with Juliann Pulse regarding this patient.  Received voicemail and then notification the mailbox is full therefore, unable to leave a message.  No c/o chest pain, palps, SOB or cardiac changes.   Will continue to attempt to contact [facility] regarding patient.

## 2019-08-03 NOTE — Telephone Encounter (Signed)
New Message    Beth Johnson from [facility] calling to get patient in as soon as possible.  Patient hasn't been seen since 2018 and she's having bruising around her chest area.  She also has a hard spot on upper left part of chest that popped up pretty quickly.  They would like her to come in to have EKG, labs, and ultrasound done.  I wanted to know if on 10/7 could we use the 48 hour spot at 1:40 or the DOD spot at 3:20.  They said they can get her on short notice.  Please let me or patient know if that would be ok.

## 2019-08-04 DIAGNOSIS — R600 Localized edema: Secondary | ICD-10-CM | POA: Diagnosis not present

## 2019-08-04 DIAGNOSIS — S20222A Contusion of left back wall of thorax, initial encounter: Secondary | ICD-10-CM | POA: Diagnosis not present

## 2019-08-04 NOTE — Telephone Encounter (Signed)
Was finally able to reach [facility] regarding Mrs Natter.  Pt is non-verbal at this point.  She has been evaluated by NP who suggested pt be taken to the ER for further evaluation.  Daughter does not want to do this as noone can go with the patient to the hospital and pt does not speak and needs complete care including being fed.  Pt has no complaints.  She has not had any physical trauma they are aware of but has some bruising and a knot/hard spot on her chest.  Daughter and caregiver are requesting an appt with Dr Marlou Porch.   Dr Marlou Porch is aware and agreeable.  appt has been schedule for Wednesday 10/7.  Pt has not had any s/s of COVID-19 and had a negative test resulted yesterday.  They are aware to bring the patient masked and with her medication list and insurance cards.  Aware to arrive no more than 15 mins prior to her appt.  They will c/b with any questions or concerns prior to the appt.

## 2019-08-06 DIAGNOSIS — J90 Pleural effusion, not elsewhere classified: Secondary | ICD-10-CM | POA: Diagnosis not present

## 2019-08-06 DIAGNOSIS — I517 Cardiomegaly: Secondary | ICD-10-CM | POA: Diagnosis not present

## 2019-08-06 DIAGNOSIS — N6489 Other specified disorders of breast: Secondary | ICD-10-CM | POA: Diagnosis not present

## 2019-08-06 DIAGNOSIS — Z741 Need for assistance with personal care: Secondary | ICD-10-CM | POA: Diagnosis not present

## 2019-08-06 DIAGNOSIS — R600 Localized edema: Secondary | ICD-10-CM | POA: Diagnosis not present

## 2019-08-06 DIAGNOSIS — I700 Atherosclerosis of aorta: Secondary | ICD-10-CM | POA: Diagnosis not present

## 2019-08-06 DIAGNOSIS — S2002XA Contusion of left breast, initial encounter: Secondary | ICD-10-CM | POA: Diagnosis not present

## 2019-08-06 DIAGNOSIS — Z7901 Long term (current) use of anticoagulants: Secondary | ICD-10-CM | POA: Diagnosis not present

## 2019-08-06 DIAGNOSIS — R410 Disorientation, unspecified: Secondary | ICD-10-CM | POA: Diagnosis not present

## 2019-08-06 DIAGNOSIS — F0390 Unspecified dementia without behavioral disturbance: Secondary | ICD-10-CM | POA: Diagnosis not present

## 2019-08-06 DIAGNOSIS — S20222A Contusion of left back wall of thorax, initial encounter: Secondary | ICD-10-CM | POA: Diagnosis not present

## 2019-08-10 ENCOUNTER — Ambulatory Visit: Payer: Medicare Other | Admitting: Cardiology

## 2019-08-11 DIAGNOSIS — E0580 Other thyrotoxicosis without thyrotoxic crisis or storm: Secondary | ICD-10-CM | POA: Diagnosis not present

## 2019-08-11 DIAGNOSIS — G309 Alzheimer's disease, unspecified: Secondary | ICD-10-CM | POA: Diagnosis not present

## 2019-08-11 DIAGNOSIS — F0150 Vascular dementia without behavioral disturbance: Secondary | ICD-10-CM | POA: Diagnosis not present

## 2019-08-11 DIAGNOSIS — E860 Dehydration: Secondary | ICD-10-CM | POA: Diagnosis not present

## 2019-08-11 DIAGNOSIS — E039 Hypothyroidism, unspecified: Secondary | ICD-10-CM | POA: Diagnosis not present

## 2019-08-11 DIAGNOSIS — Z79899 Other long term (current) drug therapy: Secondary | ICD-10-CM | POA: Diagnosis not present

## 2019-08-20 DIAGNOSIS — L89611 Pressure ulcer of right heel, stage 1: Secondary | ICD-10-CM | POA: Diagnosis not present

## 2019-08-20 DIAGNOSIS — I739 Peripheral vascular disease, unspecified: Secondary | ICD-10-CM | POA: Diagnosis not present

## 2019-08-20 DIAGNOSIS — M24571 Contracture, right ankle: Secondary | ICD-10-CM | POA: Diagnosis not present

## 2019-08-20 DIAGNOSIS — B351 Tinea unguium: Secondary | ICD-10-CM | POA: Diagnosis not present

## 2019-09-12 DIAGNOSIS — I480 Paroxysmal atrial fibrillation: Secondary | ICD-10-CM | POA: Diagnosis not present

## 2019-09-12 DIAGNOSIS — I5031 Acute diastolic (congestive) heart failure: Secondary | ICD-10-CM | POA: Diagnosis not present

## 2019-09-12 DIAGNOSIS — G308 Other Alzheimer's disease: Secondary | ICD-10-CM | POA: Diagnosis not present

## 2019-09-12 DIAGNOSIS — F0280 Dementia in other diseases classified elsewhere without behavioral disturbance: Secondary | ICD-10-CM | POA: Diagnosis not present

## 2019-09-12 DIAGNOSIS — R627 Adult failure to thrive: Secondary | ICD-10-CM | POA: Diagnosis not present

## 2019-09-12 DIAGNOSIS — I10 Essential (primary) hypertension: Secondary | ICD-10-CM | POA: Diagnosis not present

## 2019-09-12 DIAGNOSIS — E038 Other specified hypothyroidism: Secondary | ICD-10-CM | POA: Diagnosis not present

## 2019-09-12 DIAGNOSIS — N183 Chronic kidney disease, stage 3 unspecified: Secondary | ICD-10-CM | POA: Diagnosis not present

## 2019-09-12 DIAGNOSIS — E870 Hyperosmolality and hypernatremia: Secondary | ICD-10-CM | POA: Diagnosis not present

## 2019-09-15 DIAGNOSIS — I5031 Acute diastolic (congestive) heart failure: Secondary | ICD-10-CM | POA: Diagnosis not present

## 2019-09-15 DIAGNOSIS — R627 Adult failure to thrive: Secondary | ICD-10-CM | POA: Diagnosis not present

## 2019-09-15 DIAGNOSIS — Z452 Encounter for adjustment and management of vascular access device: Secondary | ICD-10-CM | POA: Diagnosis not present

## 2019-09-15 DIAGNOSIS — I480 Paroxysmal atrial fibrillation: Secondary | ICD-10-CM | POA: Diagnosis not present

## 2019-09-15 DIAGNOSIS — L89150 Pressure ulcer of sacral region, unstageable: Secondary | ICD-10-CM | POA: Diagnosis not present

## 2019-09-16 DIAGNOSIS — Z79899 Other long term (current) drug therapy: Secondary | ICD-10-CM | POA: Diagnosis not present

## 2019-09-16 DIAGNOSIS — L89619 Pressure ulcer of right heel, unspecified stage: Secondary | ICD-10-CM | POA: Diagnosis not present

## 2019-09-16 DIAGNOSIS — Z7189 Other specified counseling: Secondary | ICD-10-CM | POA: Diagnosis not present

## 2019-09-16 DIAGNOSIS — L89159 Pressure ulcer of sacral region, unspecified stage: Secondary | ICD-10-CM | POA: Diagnosis not present

## 2019-09-16 DIAGNOSIS — Z8673 Personal history of transient ischemic attack (TIA), and cerebral infarction without residual deficits: Secondary | ICD-10-CM | POA: Diagnosis not present

## 2019-09-16 DIAGNOSIS — I4891 Unspecified atrial fibrillation: Secondary | ICD-10-CM | POA: Diagnosis not present

## 2019-09-20 DIAGNOSIS — L89150 Pressure ulcer of sacral region, unstageable: Secondary | ICD-10-CM | POA: Diagnosis not present

## 2019-09-20 DIAGNOSIS — Z452 Encounter for adjustment and management of vascular access device: Secondary | ICD-10-CM | POA: Diagnosis not present

## 2019-09-20 DIAGNOSIS — F0280 Dementia in other diseases classified elsewhere without behavioral disturbance: Secondary | ICD-10-CM | POA: Diagnosis not present

## 2019-09-20 DIAGNOSIS — R627 Adult failure to thrive: Secondary | ICD-10-CM | POA: Diagnosis not present

## 2019-09-20 DIAGNOSIS — G308 Other Alzheimer's disease: Secondary | ICD-10-CM | POA: Diagnosis not present

## 2019-09-21 DIAGNOSIS — Z7189 Other specified counseling: Secondary | ICD-10-CM | POA: Diagnosis not present

## 2019-09-21 DIAGNOSIS — L89159 Pressure ulcer of sacral region, unspecified stage: Secondary | ICD-10-CM | POA: Diagnosis not present

## 2019-09-21 DIAGNOSIS — Z8673 Personal history of transient ischemic attack (TIA), and cerebral infarction without residual deficits: Secondary | ICD-10-CM | POA: Diagnosis not present

## 2019-09-21 DIAGNOSIS — Z79899 Other long term (current) drug therapy: Secondary | ICD-10-CM | POA: Diagnosis not present

## 2019-09-21 DIAGNOSIS — L89619 Pressure ulcer of right heel, unspecified stage: Secondary | ICD-10-CM | POA: Diagnosis not present

## 2019-09-21 DIAGNOSIS — I4891 Unspecified atrial fibrillation: Secondary | ICD-10-CM | POA: Diagnosis not present

## 2019-09-27 DIAGNOSIS — R627 Adult failure to thrive: Secondary | ICD-10-CM | POA: Diagnosis not present

## 2019-09-27 DIAGNOSIS — Z452 Encounter for adjustment and management of vascular access device: Secondary | ICD-10-CM | POA: Diagnosis not present

## 2019-09-27 DIAGNOSIS — Z8673 Personal history of transient ischemic attack (TIA), and cerebral infarction without residual deficits: Secondary | ICD-10-CM | POA: Diagnosis not present

## 2019-09-27 DIAGNOSIS — L89150 Pressure ulcer of sacral region, unstageable: Secondary | ICD-10-CM | POA: Diagnosis not present

## 2019-09-27 DIAGNOSIS — L89619 Pressure ulcer of right heel, unspecified stage: Secondary | ICD-10-CM | POA: Diagnosis not present

## 2019-09-27 DIAGNOSIS — I4891 Unspecified atrial fibrillation: Secondary | ICD-10-CM | POA: Diagnosis not present

## 2019-09-27 DIAGNOSIS — L89159 Pressure ulcer of sacral region, unspecified stage: Secondary | ICD-10-CM | POA: Diagnosis not present

## 2019-09-27 DIAGNOSIS — Z79899 Other long term (current) drug therapy: Secondary | ICD-10-CM | POA: Diagnosis not present

## 2019-09-27 DIAGNOSIS — Z7189 Other specified counseling: Secondary | ICD-10-CM | POA: Diagnosis not present

## 2019-09-30 DIAGNOSIS — Z8673 Personal history of transient ischemic attack (TIA), and cerebral infarction without residual deficits: Secondary | ICD-10-CM | POA: Diagnosis not present

## 2019-09-30 DIAGNOSIS — Z79899 Other long term (current) drug therapy: Secondary | ICD-10-CM | POA: Diagnosis not present

## 2019-09-30 DIAGNOSIS — Z7189 Other specified counseling: Secondary | ICD-10-CM | POA: Diagnosis not present

## 2019-09-30 DIAGNOSIS — I4891 Unspecified atrial fibrillation: Secondary | ICD-10-CM | POA: Diagnosis not present

## 2019-09-30 DIAGNOSIS — L89159 Pressure ulcer of sacral region, unspecified stage: Secondary | ICD-10-CM | POA: Diagnosis not present

## 2019-09-30 DIAGNOSIS — L89619 Pressure ulcer of right heel, unspecified stage: Secondary | ICD-10-CM | POA: Diagnosis not present

## 2019-11-25 IMAGING — CT CT HEAD W/O CM
3 series · 15 of 47 positions shown, 18 images · non-contrast
Comparison: March 14, 2018

CLINICAL DATA: Erratic behavior.  Underlying Alzheimer's dementia

EXAM:
CT HEAD WITHOUT CONTRAST
TECHNIQUE: Contiguous axial images were obtained from the base of the skull
through the vertex without intravenous contrast.

[Series 2: head wo · axial · 0.41mm/px · z∈[-166,-41]mm · 9 of 30 slices shown, 12 images]
[im 3/30  brain]
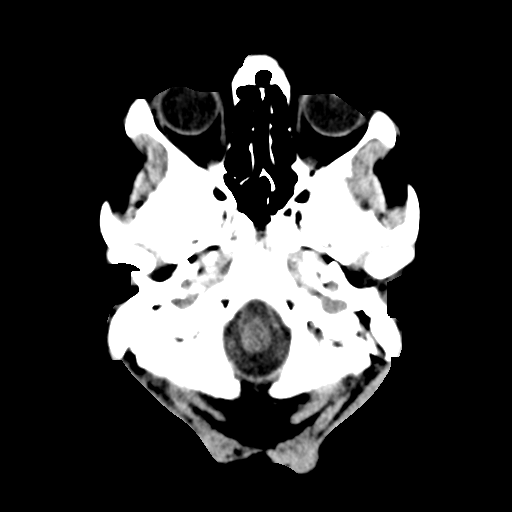
[im 3/30  bone]
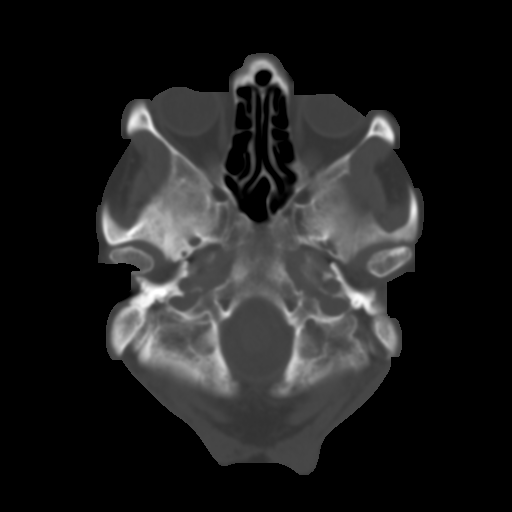
[im 6/30  brain]
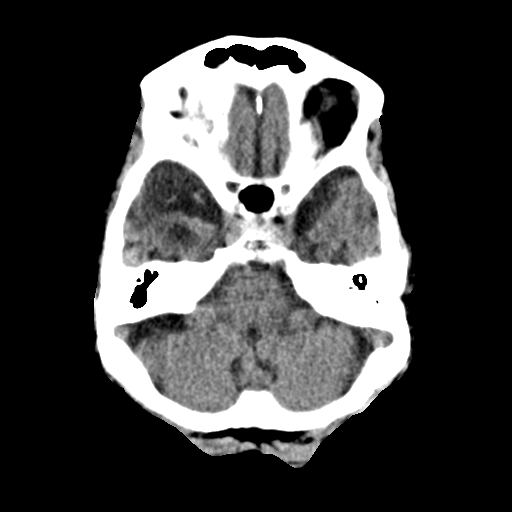
[im 9/30  brain]
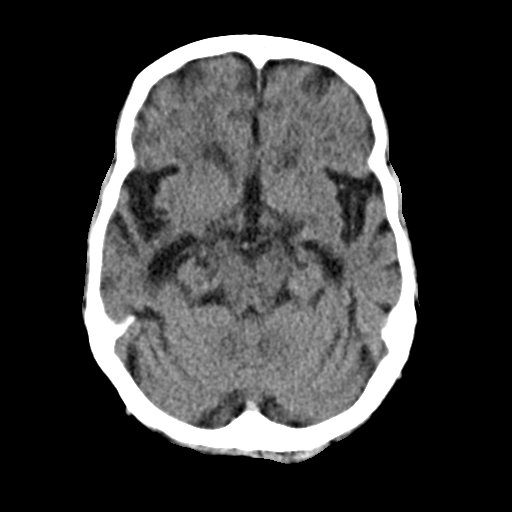
[im 12/30  brain]
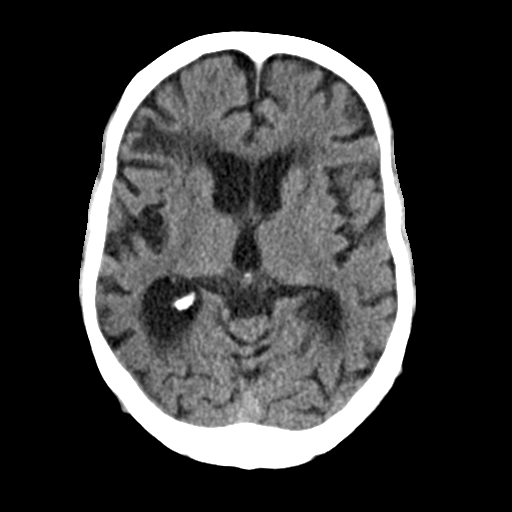
[im 16/30  brain]
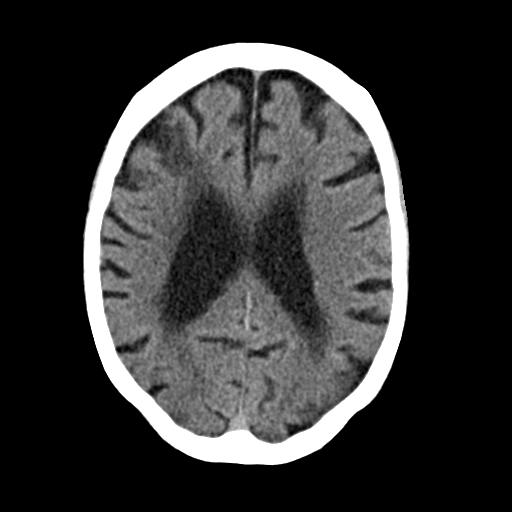
[im 16/30  bone]
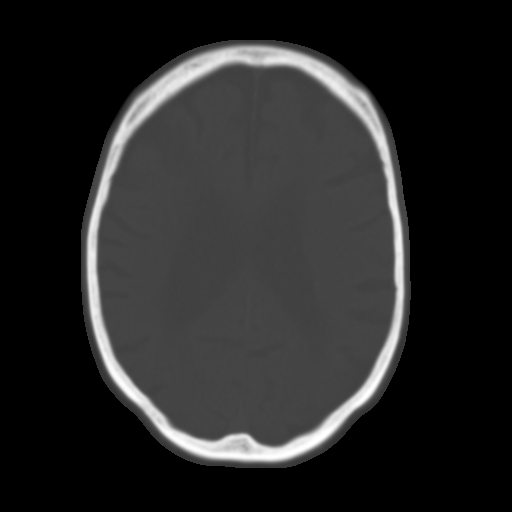
[im 19/30  brain]
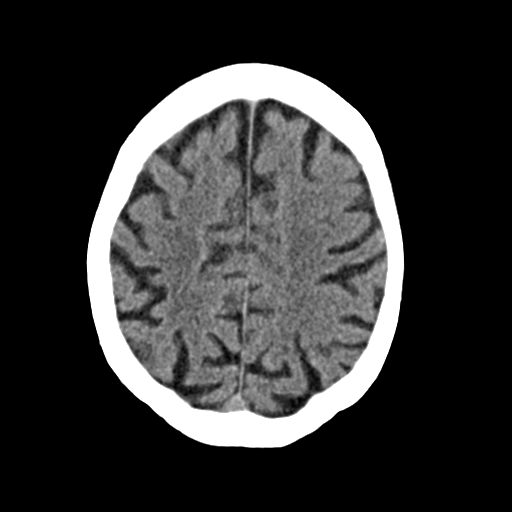
[im 22/30  brain]
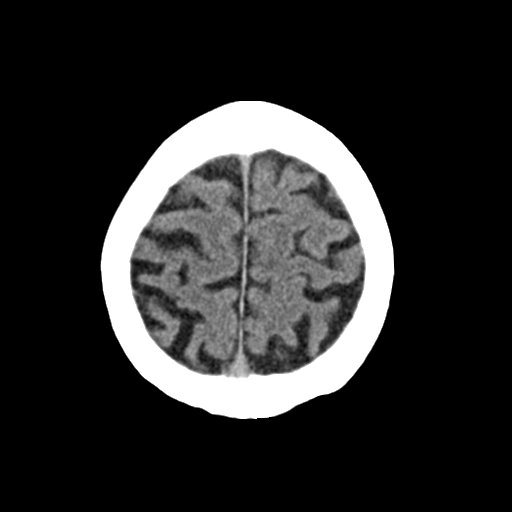
[im 25/30  brain]
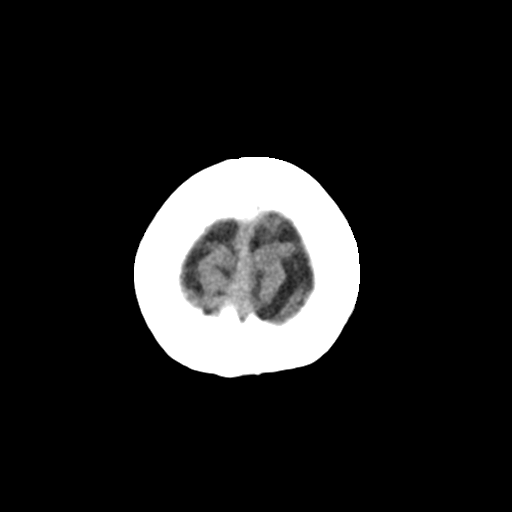
[im 28/30  brain]
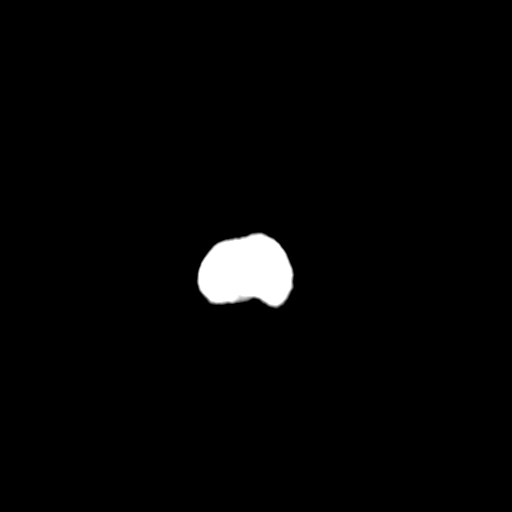
[im 28/30  bone]
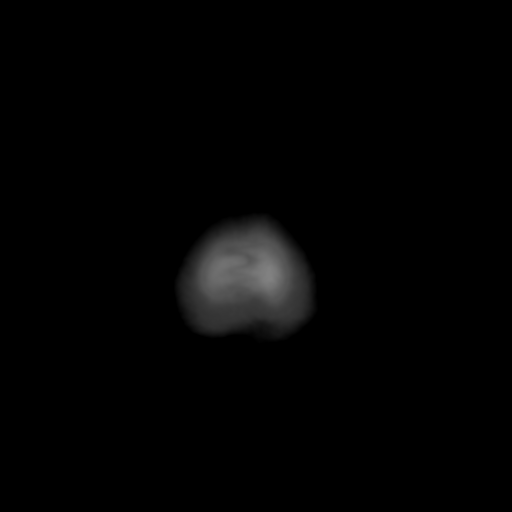

[Series 4: coronal soft · coronal · 0.29mm/px · 3 of 63 slices shown]
[im 21/63  brain]
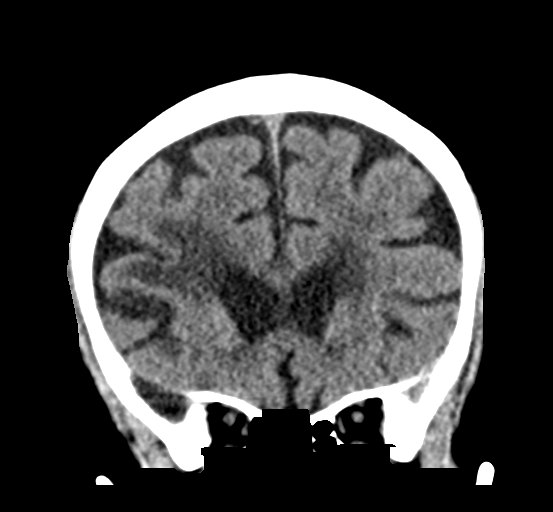
[im 28/63  brain]
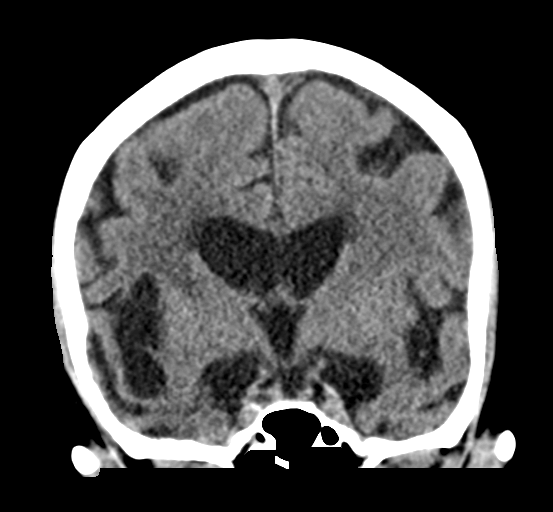
[im 35/63  brain]
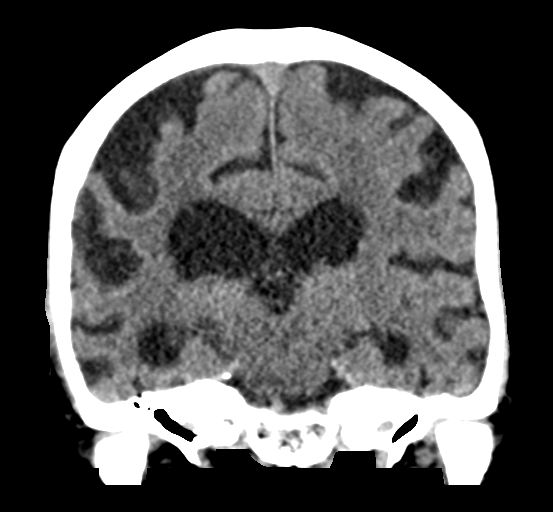

[Series 5: sag soft · sagittal · 0.30mm/px · 3 of 51 slices shown]
[im 17/51  brain]
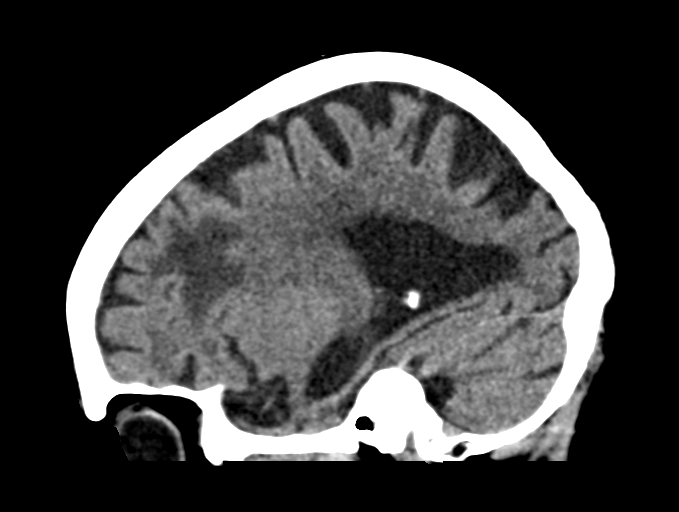
[im 26/51  brain]
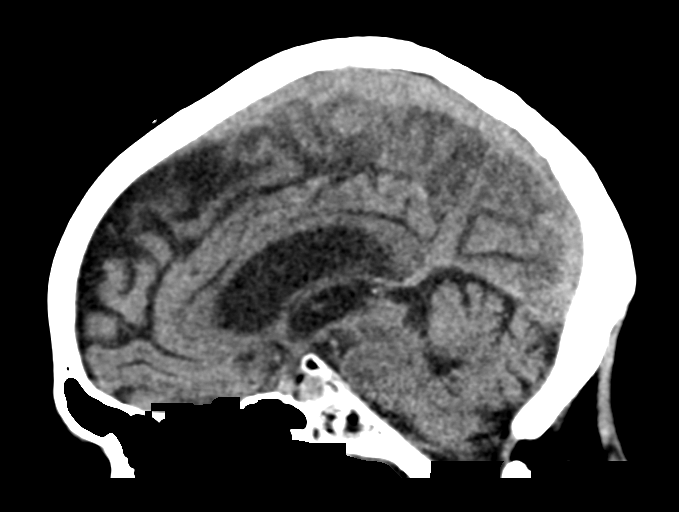
[im 34/51  brain]
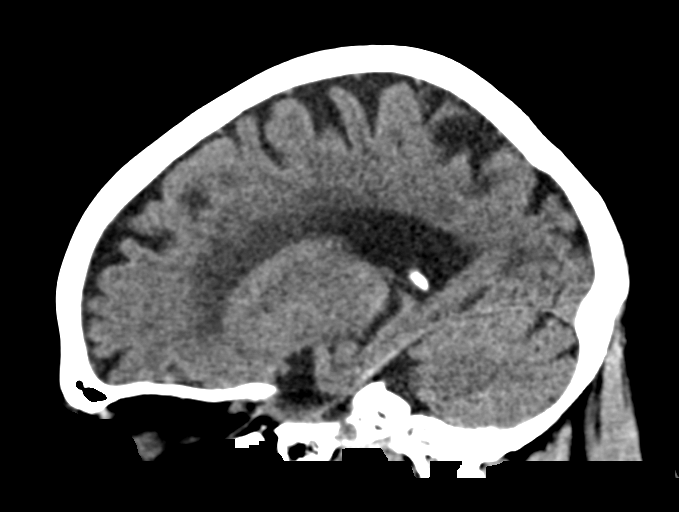

[15 of 47 positions shown; findings below may reference images not displayed]

FINDINGS: Brain: Moderate diffuse atrophy is stable. There is no intracranial
mass, hemorrhage, extra-axial fluid collection, or midline shift.
There is patchy small vessel disease in the centra semiovale
bilaterally. There is evidence of a prior infarct in the right mid
frontal lobe region. There is evidence of a prior infarct involving
a portion of the right external capsule and adjacent claustrum.
There is also evident prior infarct involving a portion of the right
insular cortex and extreme capsule, stable. There is no evident new
gray-white compartment lesion. No acute infarct is evident.

Vascular: No hyperdense vessel. No vascular calcifications are
evident.

Skull: The bony calvarium appears intact.

Sinuses/Orbits: There is mucosal thickening in several ethmoid air
cells. Other visualized paranasal sinuses are clear. Visualized
orbits appear symmetric bilaterally.

Other: Mastoid air cells are clear.
IMPRESSION: Atrophy with prior right frontal right insular cortex/anterior
claustrum region infarcts. There is patchy small vessel disease in
the centra semiovale bilaterally. No acute infarct evident. No mass
or hemorrhage.

There is mucosal thickening in several ethmoid air cells.

## 2021-11-03 DEATH — deceased
